# Patient Record
Sex: Male | Born: 1945 | Hispanic: No | Smoking: Never smoker
Health system: Southern US, Community
[De-identification: ages and names within clinical notes are randomized; demographics above are authoritative.]

## PROBLEM LIST (undated history)

## (undated) DIAGNOSIS — H3530 Unspecified macular degeneration: Secondary | ICD-10-CM

## (undated) DIAGNOSIS — T4145XA Adverse effect of unspecified anesthetic, initial encounter: Secondary | ICD-10-CM

## (undated) DIAGNOSIS — I10 Essential (primary) hypertension: Secondary | ICD-10-CM

## (undated) DIAGNOSIS — I209 Angina pectoris, unspecified: Secondary | ICD-10-CM

## (undated) DIAGNOSIS — Z9114 Patient's other noncompliance with medication regimen: Secondary | ICD-10-CM

## (undated) DIAGNOSIS — R0602 Shortness of breath: Secondary | ICD-10-CM

## (undated) DIAGNOSIS — K219 Gastro-esophageal reflux disease without esophagitis: Secondary | ICD-10-CM

## (undated) DIAGNOSIS — F200 Paranoid schizophrenia: Secondary | ICD-10-CM

## (undated) DIAGNOSIS — G4733 Obstructive sleep apnea (adult) (pediatric): Secondary | ICD-10-CM

## (undated) DIAGNOSIS — Z91148 Patient's other noncompliance with medication regimen for other reason: Secondary | ICD-10-CM

## (undated) DIAGNOSIS — R569 Unspecified convulsions: Secondary | ICD-10-CM

## (undated) DIAGNOSIS — K2210 Ulcer of esophagus without bleeding: Secondary | ICD-10-CM

## (undated) DIAGNOSIS — T8859XA Other complications of anesthesia, initial encounter: Secondary | ICD-10-CM

## (undated) DIAGNOSIS — F32A Depression, unspecified: Secondary | ICD-10-CM

## (undated) DIAGNOSIS — G20A1 Parkinson's disease without dyskinesia, without mention of fluctuations: Secondary | ICD-10-CM

## (undated) DIAGNOSIS — N400 Enlarged prostate without lower urinary tract symptoms: Secondary | ICD-10-CM

## (undated) DIAGNOSIS — I2510 Atherosclerotic heart disease of native coronary artery without angina pectoris: Secondary | ICD-10-CM

## (undated) DIAGNOSIS — E1142 Type 2 diabetes mellitus with diabetic polyneuropathy: Secondary | ICD-10-CM

## (undated) DIAGNOSIS — G20 Parkinson's disease: Secondary | ICD-10-CM

## (undated) DIAGNOSIS — Z77098 Contact with and (suspected) exposure to other hazardous, chiefly nonmedicinal, chemicals: Secondary | ICD-10-CM

## (undated) DIAGNOSIS — E785 Hyperlipidemia, unspecified: Secondary | ICD-10-CM

## (undated) DIAGNOSIS — F419 Anxiety disorder, unspecified: Secondary | ICD-10-CM

## (undated) DIAGNOSIS — A159 Respiratory tuberculosis unspecified: Secondary | ICD-10-CM

## (undated) DIAGNOSIS — F329 Major depressive disorder, single episode, unspecified: Secondary | ICD-10-CM

## (undated) DIAGNOSIS — F4310 Post-traumatic stress disorder, unspecified: Secondary | ICD-10-CM

## (undated) DIAGNOSIS — S37009A Unspecified injury of unspecified kidney, initial encounter: Secondary | ICD-10-CM

## (undated) DIAGNOSIS — E119 Type 2 diabetes mellitus without complications: Secondary | ICD-10-CM

## (undated) HISTORY — PX: TRANSURETHRAL RESECTION OF PROSTATE: SHX73

## (undated) HISTORY — PX: NASAL SEPTUM SURGERY: SHX37

## (undated) HISTORY — PX: OTHER SURGICAL HISTORY: SHX169

## (undated) HISTORY — PX: RETINAL DETACHMENT SURGERY: SHX105

## (undated) HISTORY — PX: UVULOPALATOPHARYNGOPLASTY: SHX827

## (undated) HISTORY — PX: CORNEAL TRANSPLANT: SHX108

---
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
## 1989-05-18 HISTORY — PX: CHOLECYSTECTOMY: SHX55

## 2001-08-23 ENCOUNTER — Emergency Department (HOSPITAL_COMMUNITY): Admission: EM | Admit: 2001-08-23 | Discharge: 2001-08-23 | Payer: Self-pay | Admitting: Emergency Medicine

## 2004-03-10 ENCOUNTER — Emergency Department (HOSPITAL_COMMUNITY): Admission: EM | Admit: 2004-03-10 | Discharge: 2004-03-10 | Payer: Self-pay | Admitting: Emergency Medicine

## 2005-05-04 ENCOUNTER — Emergency Department (HOSPITAL_COMMUNITY): Admission: EM | Admit: 2005-05-04 | Discharge: 2005-05-04 | Payer: Self-pay | Admitting: Emergency Medicine

## 2005-10-24 ENCOUNTER — Emergency Department (HOSPITAL_COMMUNITY): Admission: EM | Admit: 2005-10-24 | Discharge: 2005-10-24 | Payer: Self-pay | Admitting: Emergency Medicine

## 2005-10-31 ENCOUNTER — Emergency Department (HOSPITAL_COMMUNITY): Admission: EM | Admit: 2005-10-31 | Discharge: 2005-10-31 | Payer: Self-pay | Admitting: Podiatry

## 2005-12-13 ENCOUNTER — Inpatient Hospital Stay (HOSPITAL_COMMUNITY): Admission: EM | Admit: 2005-12-13 | Discharge: 2005-12-14 | Payer: Self-pay | Admitting: Emergency Medicine

## 2006-09-17 HISTORY — PX: CIRCUMCISION: SUR203

## 2006-10-03 ENCOUNTER — Emergency Department: Payer: Self-pay | Admitting: Emergency Medicine

## 2007-05-29 ENCOUNTER — Emergency Department (HOSPITAL_COMMUNITY): Admission: EM | Admit: 2007-05-29 | Discharge: 2007-05-29 | Payer: Self-pay | Admitting: Emergency Medicine

## 2008-09-29 ENCOUNTER — Ambulatory Visit (HOSPITAL_BASED_OUTPATIENT_CLINIC_OR_DEPARTMENT_OTHER): Admission: RE | Admit: 2008-09-29 | Discharge: 2008-09-29 | Payer: Self-pay | Admitting: Psychiatry

## 2008-10-02 ENCOUNTER — Ambulatory Visit: Payer: Self-pay | Admitting: Internal Medicine

## 2008-10-22 ENCOUNTER — Emergency Department (HOSPITAL_COMMUNITY): Admission: EM | Admit: 2008-10-22 | Discharge: 2008-10-23 | Payer: Self-pay | Admitting: Emergency Medicine

## 2009-04-29 ENCOUNTER — Emergency Department (HOSPITAL_COMMUNITY): Admission: EM | Admit: 2009-04-29 | Discharge: 2009-04-29 | Payer: Self-pay | Admitting: Emergency Medicine

## 2010-01-30 ENCOUNTER — Emergency Department (HOSPITAL_COMMUNITY): Admission: EM | Admit: 2010-01-30 | Discharge: 2010-01-30 | Payer: Self-pay | Admitting: Emergency Medicine

## 2010-12-04 LAB — GLUCOSE, CAPILLARY: Glucose-Capillary: 392 mg/dL — ABNORMAL HIGH (ref 70–99)

## 2010-12-04 LAB — URINALYSIS, ROUTINE W REFLEX MICROSCOPIC
Bilirubin Urine: NEGATIVE
Glucose, UA: >1000 mg/dL — AB
Nitrite: NEGATIVE
Protein, ur: NEGATIVE mg/dL
Specific Gravity, Urine: 1.021 (ref 1.005–1.030)
Urobilinogen, UA: 1 mg/dL (ref 0.0–1.0)
pH: 6.5 (ref 5.0–8.0)

## 2010-12-04 LAB — DIFFERENTIAL
Eosinophils Absolute: 0.2 10*3/uL (ref 0.0–0.7)
Eosinophils Relative: 2 % (ref 0–5)
Lymphocytes Relative: 22 % (ref 12–46)
Lymphs Abs: 1.7 10*3/uL (ref 0.7–4.0)
Neutrophils Relative %: 66 % (ref 43–77)

## 2010-12-04 LAB — RAPID URINE DRUG SCREEN, HOSP PERFORMED
Amphetamines: NOT DETECTED
Barbiturates: NOT DETECTED

## 2010-12-04 LAB — CBC
HCT: 43.4 % (ref 39.0–52.0)
MCHC: 35.5 g/dL (ref 30.0–36.0)
Platelets: 127 10*3/uL — ABNORMAL LOW (ref 150–400)
RBC: 4.59 MIL/uL (ref 4.22–5.81)
WBC: 7.7 10*3/uL (ref 4.0–10.5)

## 2010-12-04 LAB — ETHANOL: Alcohol, Ethyl (B): <5 mg/dL (ref 0–10)

## 2010-12-04 LAB — COMPREHENSIVE METABOLIC PANEL
Albumin: 4.2 g/dL (ref 3.5–5.2)
BUN: 7 mg/dL (ref 6–23)
GFR calc Af Amer: >60 mL/min (ref >60)
GFR calc non Af Amer: 59 mL/min — ABNORMAL LOW (ref >60)
Potassium: 3 mEq/L — ABNORMAL LOW (ref 3.5–5.1)
Sodium: 132 mEq/L — ABNORMAL LOW (ref 135–145)
Total Bilirubin: 1.2 mg/dL (ref 0.3–1.2)
Total Protein: 6.9 g/dL (ref 6.0–8.3)

## 2010-12-23 LAB — URINE MICROSCOPIC-ADD ON

## 2010-12-23 LAB — GLUCOSE, CAPILLARY: Glucose-Capillary: 247 mg/dL — ABNORMAL HIGH (ref 70–99)

## 2010-12-23 LAB — URINALYSIS, ROUTINE W REFLEX MICROSCOPIC
Bilirubin Urine: NEGATIVE
Glucose, UA: 500 mg/dL — AB
Leukocytes, UA: NEGATIVE
Nitrite: NEGATIVE

## 2010-12-23 LAB — POCT I-STAT, CHEM 8
Calcium, Ion: 1.16 mmol/L (ref 1.12–1.32)
Hemoglobin: 15.6 g/dL (ref 13.0–17.0)

## 2011-01-02 LAB — URINALYSIS, ROUTINE W REFLEX MICROSCOPIC
Bilirubin Urine: NEGATIVE
Glucose, UA: >1000 mg/dL — AB
Hgb urine dipstick: NEGATIVE
Leukocytes, UA: NEGATIVE
Nitrite: NEGATIVE
Protein, ur: 100 mg/dL — AB
Specific Gravity, Urine: 1.045 — ABNORMAL HIGH (ref 1.005–1.030)
Urobilinogen, UA: 2 mg/dL — ABNORMAL HIGH (ref 0.0–1.0)
pH: 6 (ref 5.0–8.0)

## 2011-01-02 LAB — BASIC METABOLIC PANEL
BUN: 15 mg/dL (ref 6–23)
CO2: 27 mEq/L (ref 19–32)
Calcium: 9.5 mg/dL (ref 8.4–10.5)
Chloride: 95 mEq/L — ABNORMAL LOW (ref 96–112)
Creatinine, Ser: 1.08 mg/dL (ref 0.4–1.5)
GFR calc Af Amer: >60 mL/min (ref >60)
GFR calc non Af Amer: >60 mL/min (ref >60)
Glucose, Bld: 278 mg/dL — ABNORMAL HIGH (ref 70–99)
Potassium: 3.6 mEq/L (ref 3.5–5.1)
Sodium: 132 mEq/L — ABNORMAL LOW (ref 135–145)

## 2011-01-02 LAB — URINE MICROSCOPIC-ADD ON

## 2011-01-02 LAB — GLUCOSE, CAPILLARY

## 2011-01-30 NOTE — Procedures (Signed)
NAME:  Bruce [name], Bruce [name] NO.:  [id number]   MEDICAL RECORD NO.:  [id number]          PATIENT TYPE:  OUT   LOCATION:  SLEEP CENTER                 FACILITY:  [facility]   PHYSICIAN:  Clinton D. Maple Hudson, MD, FCCP, FACPDATE OF BIRTH:  May [birth year] 1947   DATE OF STUDY:  09/29/2008                            NOCTURNAL POLYSOMNOGRAM   REFERRING PHYSICIAN:   REFERRING PHYSICIAN:  Dr. August Saucer.   DATE OF STUDY:  September 29, 2008   INDICATION FOR STUDY:  Hypersomnia with sleep apnea.   EPWORTH SLEEPINESS SCORE:  Epworth sleepiness score 17/24.  BMI 32.3.  Weight 188 pounds.  Height 64 inches.  Neck 14 inches.   MEDICATIONS:  Home medications were charted and reviewed.  The patient  took insulin.   SLEEP ARCHITECTURE:  Split-study protocol.  During the diagnostic phase,  total sleep time 134 minutes with sleep efficiency 67.3%.  Stage I was  13.8%.  Stage II 86.2%.  Stage III absent.  REM absent.  Sleep latency  7.5 minutes.  Wake after sleep onset 41.5 minutes.  Arousal index 32.1.  Other than insulin, no bedtime medication was taken.   RESPIRATORY DATA:  Split-study protocol.  Apnea-hypopnea index (AHI)  33.9 per hour before CPAP.  A total of 76 events was counted including 3  obstructive apneas, 18 central apneas, 5 mixed apneas, and 50 hypopneas.  Events were not positional.  CPAP was titrated to 10 CWP, AHI 0.9 per  hour.  He chose a medium ResMed Quattro full face mask with heated  humidifier.   OXYGEN DATA:  Moderate snoring with oxygen desaturation to a nadir of  83% before CPAP.  After CPAP control, mean oxygen saturation on room air  was 95.9%.   CARDIAC DATA:  Normal sinus rhythm.   MOVEMENT/PARASOMNIA:  No significant limb movement disturbance.  Bathroom x1.   IMPRESSIONS/RECOMMENDATIONS:  1. Moderate obstructive sleep apnea/hypopnea syndrome, apnea-hypopnea      index 33.9 per hour with nonpositional events, moderate snoring at      oxygen desaturation to a  nadir of 83%.  2. Successful CPAP titration to 10 centimeters of water pressure,      apnea-hypopnea index 0.9 per hour.  He chose a medium ResMed      Quattro full face mask with heated humidifier.      Clinton D. Maple Hudson, MD, [facility], FACP  Diplomate, Biomedical engineer of Sleep Medicine  Electronically Signed     CDY/MEDQ  D:  10/02/2008 12:45:23  T:  10/03/2008 02:57:58  Job:  [id number]

## 2011-02-02 NOTE — Discharge Summary (Signed)
NAME:  Bruce [name], Bruce [name] NO.:  [id number]   MEDICAL RECORD NO.:  [id number]          PATIENT TYPE:  INP   LOCATION:  1422                         FACILITY:  [facility]   PHYSICIAN:  Melissa L. Ladona Ridgel, MD  DATE OF BIRTH:  [birth year]   DATE OF ADMISSION:  12/12/2005  DATE OF DISCHARGE:  12/14/2005                                 DISCHARGE SUMMARY   ADMISSION DIAGNOSIS:  Suicide attempt using insulin overdose.   DISCHARGE DIAGNOSIS:  1.  Suicidal ideation with a suicide attempt using insulin injection.  The      patient is currently under significant stress related to protracted      grief and depression related to his father's recent death and his recent      legal entanglement with the federal government.  The patient felt that      he could no longer take that and felt that taking insulin would be the      best way to go as it is very peaceful.  You just fall asleep.  Evidently      the patient took the dose of insulin and then heard voices telling him      that he should not have done that and should go to the hospital.  Thus      he drove himself to the hospital for care.  The patient was admitted to      the general medical telemetry floor and monitored with glucose      monitoring.  All of his medications regarding his insulin were held over      the past 24 to 48 hours.  He has had low blood sugars but appropriate      appetite and is eating his meals.  I, therefore, feel that he can      maintain his blood glucoses without D-5.  He should be monitored for the      next 24 hours with q.4h. blood sugar checks and his metformin should be      held until his blood sugars are appropriate for at least 24 to 48 hours.  2.  Suicidality.  The patient is still actively suicidal.  It is, therefore,      recommended that he be transferred for inpatient care.  In speaking with      the patient, federal marshal is handling his case.  He can be      transferred into the medical  care of the federal government where he      will be monitored until disposition for psychiatric care will be made.      The patient is actively suicidal and, therefore, should be on suicide      precautions.  3.  Glaucoma.  Attempts have been made to obtain the patient's medication      for his eyes.  He, however, does not know the name and [facility] has      that information.  I will attempt to call to obtain this before he      leaves the hospital, however.  This process is  also hampered by a      difficult to access system.  I will make every attempt to do the best to      discover these medications, but please be aware that he is on two eye      drops for glaucoma. One is Timoptic and the other is unknown.  Dosing at      this time is unknown.  4.  Bipolar disorder.  The patient should be maintained on his Depakote and      Abilify .  I realize that he likely will reevaluate by the government      for his psychiatric care but these medications have been in the past      been shown to control his behavior.  5.  Diabetes.  The patient evidently also takes Humulin insulin 30 units in      the morning and 20 units at night.  This should be held and he should      not have access to insulin.   DISCHARGE MEDICATIONS:  1.  Depakote 250 mg at bedtime.  2.  Abilify 15 mg at bedtime.  3.  Glucophage and insulin should be held.  4.  He can resume sliding scale insulin when his blood glucoses are improved      but this will be at the discretion of the practitioner taking care of      him.  5.  The patient also may be taking a drug which sounds like Prozac.  I will      not prescribe this medication and he should be evaluated by psychiatry      for further medications for his depression.   HISTORY OF PRESENT ILLNESS:  The patient is a very pleasant 65 year old male  with a past medical history for bipolar disorder, diabetes, and previous  multiple suicide attempts who presents to the  emergency room after he took  an overdose of insulin.  The patient states that after he found out about a  recent legal action against him, he became very distraught.  He has been  recovering from the death of his father and decided to take his life.  After  taking an overdose of a bottle and a half of insulin, he drove himself to  the hospital and requested assistance.   In the emergency room, the patient's blood sugars were in the 50's.  He was  treated appropriately and then admitted to the telemetry floor for further  evaluation.  Subsequently the patient had been off all diabetes medications  and insulin.  His blood sugars have been low but appropriate and he has been  able to resuscitate himself with oral intake when his blood sugar drops.   I have contacted the federal marshals' office with regard to this patient's  legal case. He was supposed to turn himself in yesterday.  The federal  marshals will, therefore, pick him up to be remanded to the medical care of  the federal government during a period of waiting for his legal action to  proceed forward.   During the course of the hospital stay with me, the patient has been very  pleasant. He has had no further suicidal gestures although he does state he  will kill himself again if given the opportunity.  He is very matter of fact  about this and has plans that are outlined for that.  He insists that he  does not wish to die a violent death especially  pointing out that he would  never shoot himself or anything like this but he will die is what he told  me.  He does on occasion hear voices which tell him to do things the  specifics of which were not outlined by him although he did state that on  the night that he tried to take his life, voices told him to go to the  hospital, that he was really stupid.   PHYSICAL EXAMINATION:  VITAL SIGNS: On the day of discharge, the patient's vital signs are stable.  Temperature is 98.2, blood  pressure 129/81, pulse  62, respirations 20, oxygen saturation 100%.  His blood sugars have ranged  at the lowest of 79 to their peak at 115.  He has been asymptomatic with all  low blood sugars.  GENERAL:  The patient is generally well-developed, well-nourished,  moderately obese male in no acute distress.  HEENT:  Normocephalic, atraumatic.  Pupils are equal, round and reactive to  light.  Extraocular muscles were intact.  Mucous membranes are moist.  NECK:  Supple. There is no JVD, no lymph nodes, no carotid bruits.  CHEST:  Clear to auscultation.  No rhonchi, rales or wheezes.  CARDIOVASCULAR:  Regular rate and rhythm.  Positive S1 and S2.  No S3 or S4.  No murmurs, rubs or gallops.  ABDOMEN:  Obese, nontender, nondistended with positive bowel sounds.  EXTREMITIES:  Showed no clubbing, cyanosis or edema.  NEUROLOGIC:  Awake, alert, and oriented.  Cranial nerves II-XII intact.  Power is 5/5.  Deep tendon reflexes are 2+.  PSYCHIATRIC:  The patient is matter-of-fact about his desire to kill himself  and he states, I will do it again.   At this time the patient is deemed medical stable to transfer him to a new  medical situation where he will be monitored for his blood sugars and for  his suicidal ideation.  The patient has asked if I would please call his  mother in [place] but indicates there has been a call block on that  number. He does state he is in good graces with her but he has not told her  about his arrest nor does she know about his suicide attempt.  At this time  I will defer to the Federal Agents regarding this as I have no information  about the future of his legal problems and do not wish to interfere in the  legal process or the familial process if it is not warranted.   CONDITION ON DISCHARGE:  Clinically stable but requiring further monitoring  for suicidality in particular.   DISPOSITION:  To the possession of the federal marshals.      Melissa L. Ladona Ridgel,  MD  Electronically Signed     MLT/MEDQ  D:  12/14/2005  T:  12/14/2005  Job:  [id number]

## 2011-02-02 NOTE — H&P (Signed)
NAME:  Bruce [name], Bruce [name] NO.:  [id number]   MEDICAL RECORD NO.:  [id number]          PATIENT TYPE:  OBV   LOCATION:  1422                         FACILITY:  [facility]   PHYSICIAN:  Hollice Espy, M.D.DATE OF BIRTH:  06-23-1946   DATE OF ADMISSION:  12/12/2005  DATE OF DISCHARGE:                                HISTORY & PHYSICAL   PRIMARY CARE PHYSICIAN:  [place].   PSYCHIATRIST:  In [place], as well.   CHIEF COMPLAINT:  Suicide attempt by overdose of insulin.   HISTORY OF PRESENT ILLNESS:  The patient is a 65 year old white male with a  past medical history of bipolar disorder, diabetes mellitus and previous  multiple suicide attempts who presented to the emergency room after he took  an overdose of insulin.  The patient tells me that life especially has been  very stressful.  His father recently passed away from diabetes  complications, and he is facing legal issues, as well.  He could not take it  anymore, and then today he took a bottle and a half of NovoLog insulin.  He  started to have a change of heart and spoke to his doctor, who advised him  to come to the emergency room for treatment.  The patient came to the  emergency room and was started on a D-5 saline drip.  Initially when he had  come in, his blood sugars were in the 150s; however, over the course of  several hours, his blood sugars started trending down, down to about 50.  The patient's blood sugar started to improve on the Dextrostrip, and since  then has come up to the 70s and 80s.  Currently, his last blood sugar was  120.  The patient, however, still expressed suicidal ideation, saying that  he thinks that if he leaves he will continue to try to kill himself again.  He is willing, however, to try to get some help.  The patient currently is  feeling okay.  He denies any headaches, vision changes, dysphagia, chest  pain, palpitations, shortness of breath, wheeze, cough, abdominal pain,  hematuria, dysuria, constipation, diarrhea, focal extremity numbness,  weakness or pain.   REVIEW OF SYSTEMS:  Otherwise negative.   PAST MEDICAL HISTORY:  1.  Bipolar disorder.  2.  Multiple suicide attempts.  3.  Diabetes.   MEDICATIONS:  1.  Insulin NovoLog sliding scale.  2.  Humulin insulin 30 units in the morning, 20 at night.  3.  Metformin 500 p.o. b.i.d.  4.  Abilify 50 mg p.o. q.h.s.  5.  Depakote; he is unsure of the dose, but he takes 2 pills at night.   ALLERGIES:  CODEINE.   SOCIAL HISTORY:  He denies any alcohol or tobacco use, but does use  marijuana for religious purposes, which is consistent with his [place]  religion.   FAMILY HISTORY:  Notable for diabetes.   PHYSICAL EXAMINATION:  VITAL SIGNS:  The patient's vitals on admission  revealed a temperature of 98.3, heart rate 84, blood pressure 152/92, now  down to 137/76, respirations 16, O2 saturation 95% on  room air.  GENERAL:  The patient is alert and oriented x3.  In no apparent distress.  HEENT:  Normocephalic and atraumatic.  Mucous membranes are moist.  He has  no carotid bruits.  HEART:  Regular rate and rhythm.  S1 and S2.  LUNGS:  Clear to auscultation bilaterally.  ABDOMEN:  Soft, nontender, nondistended.  Positive bowel sounds.  EXTREMITIES:  No clubbing, cyanosis.  Trace pitting edema.  1+ peripheral  pulses.   LABORATORY WORK:  White count 5.1, hemoglobin and hematocrit 11.7 and 35.3,  MCV of 75.6, platelet count of 173.  Sodium 141, potassium 2.4, chloride  106, bicarbonate 29, BUN 11, creatinine 1.2, glucose 183.  LFT's  unremarkable.  Urine drug screen only noted for THC.  Alcohol level less  than 5.   ASSESSMENT AND PLAN:  1.  Hypoglycemia secondary to intentional overdose of insulin.  The patient      appears to be through the worst, but will continue to monitor his CBGs.      Will continue to follow the patient's CBGs q.2h. x12 hours, then every 6      hours.  Will also continue the  Dextrostrip.  Once he starts to become      hyperglycemic, we will resume his insulin and metformin.  2.  Bipolar disorder.  Will continue the Abilify and Depakote.  I have      already put in a consult for Behavioral Health who will see the patient,      and may very well likely need inpatient commitment.  In the meantime,      will have a 24-hour sitter and suicide precautions.      Hollice Espy, M.D.  Electronically Signed     SKK/MEDQ  D:  12/12/2005  T:  12/13/2005  Job:  [id number]   cc:   [facility]   Antonietta Breach, M.D.

## 2011-06-29 LAB — URINALYSIS, ROUTINE W REFLEX MICROSCOPIC
Glucose, UA: >1000 — AB
Ketones, ur: NEGATIVE

## 2011-06-29 LAB — CBC
HCT: 40.3
Hemoglobin: 14.2
MCHC: 35.2
MCV: 90.9
Platelets: 113 — ABNORMAL LOW
RBC: 4.43
RDW: 12.9
WBC: 5.7

## 2011-06-29 LAB — DIFFERENTIAL
Basophils Relative: 0
Eosinophils Relative: 5
Lymphocytes Relative: 36
Lymphs Abs: 2

## 2011-06-29 LAB — BASIC METABOLIC PANEL
BUN: 5 — ABNORMAL LOW
Calcium: 8.9
Chloride: 106
Creatinine, Ser: 0.99
GFR calc non Af Amer: >60
Glucose, Bld: 312 — ABNORMAL HIGH
Potassium: 3.4 — ABNORMAL LOW
Sodium: 137

## 2011-06-29 LAB — URINE MICROSCOPIC-ADD ON: Urine-Other: NONE SEEN

## 2012-02-16 DIAGNOSIS — I2510 Atherosclerotic heart disease of native coronary artery without angina pectoris: Secondary | ICD-10-CM

## 2012-02-16 HISTORY — DX: Atherosclerotic heart disease of native coronary artery without angina pectoris: I25.10

## 2012-03-02 ENCOUNTER — Inpatient Hospital Stay (HOSPITAL_COMMUNITY): Payer: Medicare Other

## 2012-03-02 ENCOUNTER — Inpatient Hospital Stay (HOSPITAL_COMMUNITY)
Admission: EM | Admit: 2012-03-02 | Discharge: 2012-03-07 | DRG: 247 | Disposition: A | Discharge status: Home / Self Care | Payer: Medicare Other | Attending: Cardiology | Admitting: Cardiology

## 2012-03-02 ENCOUNTER — Encounter (HOSPITAL_COMMUNITY): Payer: Self-pay | Admitting: *Deleted

## 2012-03-02 ENCOUNTER — Encounter (HOSPITAL_COMMUNITY)
Admission: EM | Disposition: A | Discharge status: Home / Self Care | Payer: Self-pay | Source: Home / Self Care | Attending: Cardiology

## 2012-03-02 DIAGNOSIS — R079 Chest pain, unspecified: Secondary | ICD-10-CM

## 2012-03-02 DIAGNOSIS — F200 Paranoid schizophrenia: Secondary | ICD-10-CM | POA: Diagnosis present

## 2012-03-02 DIAGNOSIS — E119 Type 2 diabetes mellitus without complications: Secondary | ICD-10-CM | POA: Diagnosis present

## 2012-03-02 DIAGNOSIS — I2510 Atherosclerotic heart disease of native coronary artery without angina pectoris: Secondary | ICD-10-CM

## 2012-03-02 DIAGNOSIS — G4733 Obstructive sleep apnea (adult) (pediatric): Secondary | ICD-10-CM | POA: Diagnosis present

## 2012-03-02 DIAGNOSIS — Z91199 Patient's noncompliance with other medical treatment and regimen due to unspecified reason: Secondary | ICD-10-CM

## 2012-03-02 DIAGNOSIS — I2119 ST elevation (STEMI) myocardial infarction involving other coronary artery of inferior wall: Secondary | ICD-10-CM

## 2012-03-02 DIAGNOSIS — Z794 Long term (current) use of insulin: Secondary | ICD-10-CM

## 2012-03-02 DIAGNOSIS — F209 Schizophrenia, unspecified: Secondary | ICD-10-CM

## 2012-03-02 DIAGNOSIS — I2582 Chronic total occlusion of coronary artery: Secondary | ICD-10-CM | POA: Diagnosis present

## 2012-03-02 DIAGNOSIS — I213 ST elevation (STEMI) myocardial infarction of unspecified site: Secondary | ICD-10-CM

## 2012-03-02 DIAGNOSIS — F4310 Post-traumatic stress disorder, unspecified: Secondary | ICD-10-CM | POA: Diagnosis present

## 2012-03-02 DIAGNOSIS — I10 Essential (primary) hypertension: Secondary | ICD-10-CM | POA: Diagnosis present

## 2012-03-02 DIAGNOSIS — Z9119 Patient's noncompliance with other medical treatment and regimen: Secondary | ICD-10-CM

## 2012-03-02 HISTORY — DX: Atherosclerotic heart disease of native coronary artery without angina pectoris: I25.10

## 2012-03-02 HISTORY — PX: CARDIAC CATHETERIZATION: SHX172

## 2012-03-02 HISTORY — DX: Post-traumatic stress disorder, unspecified: F43.10

## 2012-03-02 HISTORY — DX: Hyperlipidemia, unspecified: E78.5

## 2012-03-02 HISTORY — DX: Benign prostatic hyperplasia without lower urinary tract symptoms: N40.0

## 2012-03-02 HISTORY — DX: Paranoid schizophrenia: F20.0

## 2012-03-02 HISTORY — DX: Essential (primary) hypertension: I10

## 2012-03-02 HISTORY — PX: LEFT HEART CATHETERIZATION WITH CORONARY ANGIOGRAM: SHX5451

## 2012-03-02 LAB — TSH: TSH: 1.581 u[IU]/mL (ref 0.350–4.500)

## 2012-03-02 LAB — DIFFERENTIAL
Basophils Relative: 0 % (ref 0–1)
Eosinophils Absolute: 0.1 10*3/uL (ref 0.0–0.7)
Eosinophils Relative: 1 % (ref 0–5)
Monocytes Absolute: 0.3 10*3/uL (ref 0.1–1.0)
Monocytes Relative: 4 % (ref 3–12)
Neutrophils Relative %: 81 % — ABNORMAL HIGH (ref 43–77)

## 2012-03-02 LAB — COMPREHENSIVE METABOLIC PANEL
ALT: 32 U/L (ref 0–53)
AST: 95 U/L — ABNORMAL HIGH (ref 0–37)
CO2: 24 mEq/L (ref 19–32)
Calcium: 8.5 mg/dL (ref 8.4–10.5)
GFR calc non Af Amer: >90 mL/min (ref >90)
Sodium: 133 mEq/L — ABNORMAL LOW (ref 135–145)

## 2012-03-02 LAB — CBC
HCT: 40 % (ref 39.0–52.0)
HCT: 39.9 % (ref 39.0–52.0)
Hemoglobin: 14.4 g/dL (ref 13.0–17.0)
Hemoglobin: 14.5 g/dL (ref 13.0–17.0)
MCH: 32.6 pg (ref 26.0–34.0)
MCH: 32.7 pg (ref 26.0–34.0)
MCHC: 36 g/dL (ref 30.0–36.0)
MCHC: 36.3 g/dL — ABNORMAL HIGH (ref 30.0–36.0)
MCV: 90.5 fL (ref 78.0–100.0)
RBC: 4.43 MIL/uL (ref 4.22–5.81)

## 2012-03-02 LAB — GLUCOSE, CAPILLARY: Glucose-Capillary: 130 mg/dL — ABNORMAL HIGH (ref 70–99)

## 2012-03-02 LAB — HEMOGLOBIN A1C: Hgb A1c MFr Bld: 7.8 % — ABNORMAL HIGH (ref <5.7)

## 2012-03-02 LAB — CARDIAC PANEL(CRET KIN+CKTOT+MB+TROPI)
CK, MB: 87.9 ng/mL (ref 0.3–4.0)
Relative Index: 7.3 — ABNORMAL HIGH (ref 0.0–2.5)
Total CK: 1201 U/L — ABNORMAL HIGH (ref 7–232)

## 2012-03-02 SURGERY — LEFT HEART CATHETERIZATION WITH CORONARY ANGIOGRAM
Anesthesia: LOCAL

## 2012-03-02 MED ORDER — EPTIFIBATIDE 75 MG/100ML IV SOLN
INTRAVENOUS | Status: AC
  Administered 2012-03-02: 75000 ug
  Filled 2012-03-02: qty 100

## 2012-03-02 MED ORDER — ACETAMINOPHEN 325 MG PO TABS: 650.0000 mg | ORAL_TABLET | ORAL | Status: DC | PRN

## 2012-03-02 MED ORDER — METOPROLOL TARTRATE 12.5 MG HALF TABLET
12.5000 mg | ORAL_TABLET | Freq: Two times a day (BID) | ORAL | Status: DC
  Administered 2012-03-02 – 2012-03-07 (×10): 12.5 mg via ORAL
  Filled 2012-03-02 (×12): qty 1

## 2012-03-02 MED ORDER — ZOLPIDEM TARTRATE 5 MG PO TABS: 5.0000 mg | ORAL_TABLET | Freq: Every evening | ORAL | Status: DC | PRN

## 2012-03-02 MED ORDER — TAMSULOSIN HCL 0.4 MG PO CAPS
0.4000 mg | ORAL_CAPSULE | Freq: Every day | ORAL | Status: DC
  Administered 2012-03-02 – 2012-03-07 (×6): 0.4 mg via ORAL
  Filled 2012-03-02 (×6): qty 1

## 2012-03-02 MED ORDER — SODIUM CHLORIDE 0.9 % IJ SOLN: 3.0000 mL | INTRAMUSCULAR | Status: DC | PRN

## 2012-03-02 MED ORDER — EPTIFIBATIDE 75 MG/100ML IV SOLN: 13.6000 mg/h | Freq: Once | INTRAVENOUS | Status: DC

## 2012-03-02 MED ORDER — DIAZEPAM 2 MG PO TABS
2.0000 mg | ORAL_TABLET | Freq: Three times a day (TID) | ORAL | Status: DC | PRN
  Administered 2012-03-04 – 2012-03-05 (×5): 2 mg via ORAL
  Filled 2012-03-02 (×4): qty 1

## 2012-03-02 MED ORDER — SODIUM CHLORIDE 0.9 % IV SOLN: 250.0000 mL | INTRAVENOUS | Status: DC | PRN

## 2012-03-02 MED ORDER — SODIUM CHLORIDE 0.9 % IJ SOLN
3.0000 mL | Freq: Two times a day (BID) | INTRAMUSCULAR | Status: DC
  Administered 2012-03-02 – 2012-03-07 (×9): 3 mL via INTRAVENOUS

## 2012-03-02 MED ORDER — VERAPAMIL HCL 2.5 MG/ML IV SOLN
INTRAVENOUS | Status: AC
  Filled 2012-03-02: qty 2

## 2012-03-02 MED ORDER — HEPARIN BOLUS VIA INFUSION
4000.0000 [IU] | Freq: Once | INTRAVENOUS | Status: AC
  Administered 2012-03-02: 4000 [IU] via INTRAVENOUS

## 2012-03-02 MED ORDER — FENTANYL CITRATE 0.05 MG/ML IJ SOLN
INTRAMUSCULAR | Status: AC
  Filled 2012-03-02: qty 2

## 2012-03-02 MED ORDER — INSULIN ASPART PROT & ASPART (70-30 MIX) 100 UNIT/ML ~~LOC~~ SUSP
20.0000 [IU] | Freq: Every day | SUBCUTANEOUS | Status: DC
  Administered 2012-03-02 – 2012-03-07 (×5): 20 [IU] via SUBCUTANEOUS

## 2012-03-02 MED ORDER — BIVALIRUDIN 250 MG IV SOLR
INTRAVENOUS | Status: AC
  Filled 2012-03-02: qty 250

## 2012-03-02 MED ORDER — ONDANSETRON HCL 4 MG/2ML IJ SOLN
4.0000 mg | Freq: Four times a day (QID) | INTRAMUSCULAR | Status: DC | PRN
  Administered 2012-03-05: 4 mg via INTRAVENOUS
  Filled 2012-03-02: qty 2

## 2012-03-02 MED ORDER — ACETAMINOPHEN 325 MG PO TABS
650.0000 mg | ORAL_TABLET | ORAL | Status: DC | PRN
  Administered 2012-03-05: 650 mg via ORAL
  Filled 2012-03-02: qty 2

## 2012-03-02 MED ORDER — EPTIFIBATIDE 75 MG/100ML IV SOLN
INTRAVENOUS | Status: AC
  Filled 2012-03-02: qty 100

## 2012-03-02 MED ORDER — INSULIN ASPART PROT & ASPART (70-30 MIX) 100 UNIT/ML ~~LOC~~ SUSP
15.0000 [IU] | Freq: Every day | SUBCUTANEOUS | Status: DC
  Administered 2012-03-03 – 2012-03-07 (×5): 15 [IU] via SUBCUTANEOUS
  Filled 2012-03-02: qty 10

## 2012-03-02 MED ORDER — ASPIRIN 81 MG PO CHEW
81.0000 mg | CHEWABLE_TABLET | Freq: Every day | ORAL | Status: DC
  Administered 2012-03-03 – 2012-03-07 (×5): 81 mg via ORAL
  Filled 2012-03-02 (×5): qty 1

## 2012-03-02 MED ORDER — NITROGLYCERIN 0.4 MG SL SUBL: 0.4000 mg | SUBLINGUAL_TABLET | SUBLINGUAL | Status: DC | PRN

## 2012-03-02 MED ORDER — POTASSIUM CHLORIDE CRYS ER 20 MEQ PO TBCR
40.0000 meq | EXTENDED_RELEASE_TABLET | Freq: Once | ORAL | Status: AC
  Administered 2012-03-02: 40 meq via ORAL
  Filled 2012-03-02: qty 2

## 2012-03-02 MED ORDER — ASPIRIN EC 81 MG PO TBEC: 81.0000 mg | DELAYED_RELEASE_TABLET | Freq: Every day | ORAL | Status: DC

## 2012-03-02 MED ORDER — INSULIN ASPART PROT & ASPART (70-30 MIX) 100 UNIT/ML ~~LOC~~ SUSP
15.0000 [IU] | Freq: Every day | SUBCUTANEOUS | Status: DC
  Filled 2012-03-02: qty 3

## 2012-03-02 MED ORDER — ATORVASTATIN CALCIUM 80 MG PO TABS
80.0000 mg | ORAL_TABLET | Freq: Every day | ORAL | Status: DC
  Administered 2012-03-02 – 2012-03-05 (×4): 80 mg via ORAL
  Filled 2012-03-02 (×6): qty 1

## 2012-03-02 MED ORDER — TICAGRELOR 90 MG PO TABS
ORAL_TABLET | ORAL | Status: AC
  Administered 2012-03-03: 90 mg via ORAL
  Filled 2012-03-02: qty 2

## 2012-03-02 MED ORDER — ONDANSETRON HCL 4 MG/2ML IJ SOLN
INTRAMUSCULAR | Status: AC
  Filled 2012-03-02: qty 2

## 2012-03-02 MED ORDER — MIDAZOLAM HCL 2 MG/2ML IJ SOLN
INTRAMUSCULAR | Status: AC
  Filled 2012-03-02: qty 2

## 2012-03-02 MED ORDER — ALPRAZOLAM 0.25 MG PO TABS
0.2500 mg | ORAL_TABLET | Freq: Two times a day (BID) | ORAL | Status: DC | PRN
  Administered 2012-03-03 – 2012-03-05 (×6): 0.25 mg via ORAL
  Filled 2012-03-02 (×6): qty 1

## 2012-03-02 MED ORDER — LIDOCAINE HCL (PF) 1 % IJ SOLN
INTRAMUSCULAR | Status: AC
  Filled 2012-03-02: qty 30

## 2012-03-02 MED ORDER — NITROGLYCERIN 0.2 MG/ML ON CALL CATH LAB
INTRAVENOUS | Status: AC
  Filled 2012-03-02: qty 1

## 2012-03-02 MED ORDER — HEPARIN SODIUM (PORCINE) 5000 UNIT/ML IJ SOLN
INTRAMUSCULAR | Status: AC
  Filled 2012-03-02: qty 1

## 2012-03-02 MED ORDER — TICAGRELOR 90 MG PO TABS
90.0000 mg | ORAL_TABLET | Freq: Two times a day (BID) | ORAL | Status: DC
  Administered 2012-03-02 – 2012-03-04 (×5): 90 mg via ORAL
  Filled 2012-03-02 (×5): qty 1

## 2012-03-02 MED ORDER — SODIUM CHLORIDE 0.9 % IV SOLN
INTRAVENOUS | Status: AC
  Administered 2012-03-02: 14:00:00 via INTRAVENOUS

## 2012-03-02 MED ORDER — BIVALIRUDIN 250 MG IV SOLR
INTRAVENOUS | Status: AC
Start: 2012-03-02 — End: 2012-03-02
  Filled 2012-03-02: qty 250

## 2012-03-02 MED ORDER — INSULIN ASPART 100 UNIT/ML ~~LOC~~ SOLN
0.0000 [IU] | Freq: Three times a day (TID) | SUBCUTANEOUS | Status: DC
  Administered 2012-03-02: 3 [IU] via SUBCUTANEOUS
  Administered 2012-03-03: 2 [IU] via SUBCUTANEOUS
  Administered 2012-03-03: 3 [IU] via SUBCUTANEOUS
  Administered 2012-03-03 – 2012-03-04 (×2): 2 [IU] via SUBCUTANEOUS
  Administered 2012-03-04: 3 [IU] via SUBCUTANEOUS
  Administered 2012-03-05 (×3): 2 [IU] via SUBCUTANEOUS
  Administered 2012-03-06: 3 [IU] via SUBCUTANEOUS
  Administered 2012-03-07: 5 [IU] via SUBCUTANEOUS
  Administered 2012-03-07: 2 [IU] via SUBCUTANEOUS

## 2012-03-02 MED ORDER — HEPARIN (PORCINE) IN NACL 2-0.9 UNIT/ML-% IJ SOLN
INTRAMUSCULAR | Status: AC
  Filled 2012-03-02: qty 2000
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
## 2012-03-02 NOTE — Progress Notes (Signed)
Patient Bruce [name]. Bruce [name], 66 year old male arrived at E.D. via EMS with chest pains.  Chaplain received page of a Code Stemi, and lifted a silent prayer for patient in the Cath Lab.  Patient reported having no local relatives.  Chaplain and a nurse attempted to call patient's daughter-in-law in [name] [phone] & [phone].  We were unable to talk with her.  I will follow-up as needed.

## 2012-03-02 NOTE — ED Notes (Signed)
Transported to cath lab, monitored, with md and rn. [id number]

## 2012-03-02 NOTE — ED Notes (Signed)
Patient given heparin 4000 units ivp at 11:27
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
## 2012-03-02 NOTE — CV Procedure (Signed)
Cardiac Catheterization Procedure Note  Name: ANTOIN DARGIS MRN: [id number] DOB: 23-Dec-1945  Procedure: Left Heart Cath, Selective Coronary Angiography, LV angiography, PTCA and stenting of the RCA  Indication:   Acute inferior MI  Procedural Details:  Following informed emergency consent, the patient was brought emergently to the cath lab.   The right wrist was prepped, draped, and anesthetized with 1% lidocaine. Using the modified Seldinger technique, a 6 French sheath was introduced into the right radial artery. 3 mg of verapamil was administered through the sheath, weight-based unfractionated heparin was already administered intravenously in the ER per STEMI protocol. Standard Judkins catheters were used for selective coronary angiography. Catheter exchanges were performed over an exchange length guidewire.  PCI Note:  Following the diagnostic procedure, the decision was made to proceed with PCI. Weight-based bivalirudin was given for anticoagulation. Once a therapeutic ACT was achieved, a 6 [place] JR4 [facility] guide catheter was inserted.  A prowater coronary guidewire was used to cross the lesion.  The lesion was predilated with a 2.0 by 15mm BS balloon.  The lesion was then stented with a 2.25 by 32mm Promus Element stent.  The stent was postdilated with a 2.75mm noncompliant balloon.  Following PCI, there remained a modest area of malexpansion, and a 2.25 [place] balloon was taken to high pressures.  Post views demonstrated what appeared to be either tissue prolapse, or recurrent thrombus, favor the former, within the more proximal aspect of the stent.  We felt the artery was borderline with regard to IVUS, so we elected to pass a second stent, which was a 2.25 by 12 stent  This was deployed at 16 atm with full expansion.  We then elected to administer double bolus eptifibatide as an adjunct.  . Final angiography confirmed an excellent result.  LV angio was then done.  The patient tolerated the procedure  well. There were no immediate procedural complications. A TR band was used for radial hemostasis. The patient was transferred to the post catheterization recovery area for further monitoring.  Chest pain and ST changes were resolved.    PROCEDURAL FINDINGS Hemodynamics: AO 159/65 (105) LV 128/7/12 No gradient on pullback.    Coronary angiography: Coronary dominance: right  Left mainstem: Mildly calcified with smooth distal 30% tapered narrowing.  No hemodynamic obstruction.  Left anterior descending (LAD): Fairly heavily calcified with  30-40% narrowing.  The diagonal has ostial 40-50% narrowing.  Both have a modestly diabetic appearance.   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 The ramus has a mid smooth 40% area of segmental plaque.  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  Right coronary artery (RCA): The RCA was totally occluded proximally.  Following reperfusion, there was a high grade area of narrowing that extended over 25-87mm of vessel.  Following initial stenting and post dil, there was marked improvement.  However, there appeared to be tissue prolapse, which was treated with a second stent.  There is a large acute marginal that supplies the distal inferior wall.  It has 60% proximal segmental narrowing.  The distal RCA has 40-50% segmental plaque.  There are three distal branches, fairly small in caliber.    Left ventriculography: Left ventricular systolic function is mildly reduced, LVEF is estimated at 50%, there is no significant mitral regurgitation.  There is mild inferior hypokinesis.    Marland Kitchen  PCI Data: Vessel - RCA/Segment - 2 Percent Stenosis (pre)  100 TIMI-flow 0  Stent Promus Element 2.25 by 32mm, and 12 by 2.25 placed inside for tissue prolapse through stent Percent Stenosis (post) 0% TIMI-flow (post) 3  Final Conclusions:   1.  Inferior wall MI with early reperfusion via radial approach with successful  stenting of the RCA 2.  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  3.  Overall preserved LV function.   Recommendations:   1.  Treatment of other issues    -psych consult in am 2.  ASA/Brlinita 3.  6 hours of eptifibatide given tissue and or thrombus 4.  Cardiac rehab phase 1 5.  Plan fast track of issues.    [name] 03/02/2012, 1:26 PM

## 2012-03-02 NOTE — ED Notes (Signed)
Patient reports that he could not breathe today.  He went to his neighbors and called ems.  Patient with noted exp wheezing on scene,  Patient given neb treatment on scene, once placed in truck,  ekg ran and noted to have st elevation,  stemi called.  Patient arrives to ED,  Reported to have received aspirin 324 mg, nitro x 2, and morphine 4 mg.  Patient with decreased chest pain per patient.  Patient reports he has been out of medications for a while but has been taking his insulin.  Cardiology at bedside.  Patient with one iv in place upon arrival.

## 2012-03-03 ENCOUNTER — Encounter (HOSPITAL_COMMUNITY): Payer: Self-pay | Admitting: *Deleted

## 2012-03-03 LAB — LIPID PANEL
Cholesterol: 147 mg/dL (ref 0–200)
Triglycerides: 193 mg/dL — ABNORMAL HIGH (ref <150)
VLDL: 39 mg/dL (ref 0–40)

## 2012-03-03 LAB — POCT I-STAT, CHEM 8
BUN: 5 mg/dL — ABNORMAL LOW (ref 6–23)
Calcium, Ion: 1.08 mmol/L — ABNORMAL LOW (ref 1.12–1.32)
Hemoglobin: 14.3 g/dL (ref 13.0–17.0)
Sodium: 139 mEq/L (ref 135–145)
TCO2: 19 mmol/L (ref 0–100)

## 2012-03-03 LAB — BASIC METABOLIC PANEL
BUN: 5 mg/dL — ABNORMAL LOW (ref 6–23)
Chloride: 105 mEq/L (ref 96–112)
GFR calc Af Amer: >90 mL/min (ref >90)
GFR calc non Af Amer: >90 mL/min (ref >90)
Potassium: 3.5 mEq/L (ref 3.5–5.1)
Sodium: 142 mEq/L (ref 135–145)

## 2012-03-03 LAB — CBC
HCT: 41.8 % (ref 39.0–52.0)
Hemoglobin: 14.6 g/dL (ref 13.0–17.0)
MCHC: 34.9 g/dL (ref 30.0–36.0)
RBC: 4.59 MIL/uL (ref 4.22–5.81)
WBC: 8.6 10*3/uL (ref 4.0–10.5)

## 2012-03-03 LAB — CARDIAC PANEL(CRET KIN+CKTOT+MB+TROPI)
CK, MB: 39.9 ng/mL (ref 0.3–4.0)
Troponin I: >20 ng/mL (ref <0.30)

## 2012-03-03 LAB — POCT ACTIVATED CLOTTING TIME: Activated Clotting Time: 544 seconds

## 2012-03-03 LAB — GLUCOSE, CAPILLARY
Glucose-Capillary: 184 mg/dL — ABNORMAL HIGH (ref 70–99)
Glucose-Capillary: 149 mg/dL — ABNORMAL HIGH (ref 70–99)
Glucose-Capillary: 163 mg/dL — ABNORMAL HIGH (ref 70–99)
Glucose-Capillary: 137 mg/dL — ABNORMAL HIGH (ref 70–99)
Glucose-Capillary: 90 mg/dL (ref 70–99)

## 2012-03-03 MED ORDER — SALINE SPRAY 0.65 % NA SOLN
1.0000 | NASAL | Status: DC | PRN
  Filled 2012-03-03: qty 44

## 2012-03-03 MED FILL — Dextrose Inj 5%: INTRAVENOUS | Qty: 50 | Status: AC

## 2012-03-03 NOTE — Progress Notes (Signed)
Pt states his sob is relieved after the xanax.  Also states he is still having some abdominal tenderness.
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
## 2012-03-03 NOTE — Progress Notes (Signed)
Pt c/o sob and abdominal pain/tenderness.    EKG done .  Rhonda Barrett notified.  No orders received.

## 2012-03-03 NOTE — Progress Notes (Signed)
Inpatient Diabetes Program Recommendations  AACE/ADA: New Consensus Statement on Inpatient Glycemic Control (2009)  Target Ranges:  Prepandial:   less than 140 mg/dL      Peak postprandial:   less than 180 mg/dL (1-2 hours)      Critically ill patients:  140 - 180 mg/dL   Reason for Visit: consult Diabetes Coordinator spoke with patient concerning "hypoglycemia" per consult.  Patient reports no problems with hypo but hyper.  Current regimen during this hospitalization seems to be more appropriate than documented home dose 70/30.  Consider discharging patient home on current regimen of 70/30 15 units with breakfast and 20 units with dinner.  Patient reports difficulty remembering sometimes to take his insulin but was pleasantly surprised that his A1C=7.8. He reports prior A1C being in the "teens".  We discussed ways to remember taking his insulin, including using his calendar on his phone.  He was agreeable.     Will follow during this admission.  Thank you  Piedad Climes [facility] Inpatient Diabetes Coordinator [phone]

## 2012-03-03 NOTE — Progress Notes (Signed)
CARDIAC REHAB PHASE I   PRE:  Rate/Rhythm: 67SR  BP:  Supine:   Sitting: 109/72  Standing:    SaO2: 99%RA  MODE:  Ambulation: 100 ft   POST:  Rate/Rhythem: 83SR  BP:  Supine:   Sitting: 108/74  Standing:    SaO2: 100%RA 1447-1510 Pt needed much assistance to stand. Uses cane at home. Pt had to hold to siderail with asst x 1 to walk. Bent over. States he has neuropathy. Needed much asst to walk. Would recommend PT to evaluate for D/c needs. When asked if pt falls at home he stated I just tuck and roll. To recliner after walk. Denied CP. Discussed with pt using walker next time but he was not in favor of this.   Duanne Limerick

## 2012-03-03 NOTE — Progress Notes (Signed)
Subjective:  He is better.  Does complain of some shortness of breath intermittently.  Objective:  Vital Signs in the last 24 hours: Temp:  [97.5 F (36.4 C)-98.4 F (36.9 C)] 98.3 F (36.8 C) (06/17 1100) Pulse Rate:  [55-75] 75  (06/17 1100) Resp:  [14-29] 19  (06/17 1100) BP: (102-133)/(63-84) 128/84 mmHg (06/17 1000) SpO2:  [96 %-100 %] 99 % (06/17 1100) Weight:  [175 lb 7.8 oz (79.6 kg)-188 lb 15 oz (85.7 kg)] 175 lb 7.8 oz (79.6 kg) (06/17 0600)  Intake/Output from previous day: 06/16 0701 - 06/17 0700 In: 1754.4 [P.O.:800; I.V.:954.4] Out: 3225 [Urine:3225]   Physical Exam: General: Well developed, well nourished, in no acute distress. Head:  Normocephalic and atraumatic. Lungs: Clear to auscultation and percussion. Heart: Normal S1 and S2.  Pos S4  Pulses: Pulses normal in all 4 extremities. Extremities: No clubbing or cyanosis. No edema.  Wrist looks good.   Neurologic: Alert and oriented x 3.    Lab Results:  Basename 03/03/12 0454 03/02/12 2017  WBC 8.6 6.7  HGB 14.6 14.5  PLT 140* 127*    Basename 03/03/12 0454 03/02/12 1400  NA 142 133*  K 3.5 3.5  CL 105 99  CO2 26 24  GLUCOSE 138* 275*  BUN 5* 6  CREATININE 0.80 0.73    Basename 03/03/12 0454 03/02/12 2018  TROPONINI >20.00* >20.00*   Hepatic Function Panel  Basename 03/02/12 1400  PROT 6.0  ALBUMIN 3.7  AST 95*  ALT 32  ALKPHOS 68  BILITOT 0.5  BILIDIR --  IBILI --    Basename 03/03/12 0454  CHOL 147   No results found for this basename: PROTIME in the last 72 hours  Imaging: Dg Chest Port 1 View  03/02/2012  *RADIOLOGY REPORT*  Clinical Data: Shortness of breath, chest pain, myocardial infarction  PORTABLE CHEST - 1 VIEW  Comparison: 10/23/2008  Findings: Unchanged cardiac silhouette and mediastinal contours. Lung volumes are reduced with minimal bibasilar opacities, left greater than right, likely atelectasis.  No definite pleural effusion or pneumothorax.  No definite evidence  of pulmonary edema. Unchanged bones.  IMPRESSION: Decreased lung volumes without acute cardiopulmonary disease.  Original Report Authenticated By: Waynard Reeds, M.D.    EKG:  Evolving inferior MI  Cardiac Studies:  See enzyme data  Assessment/Plan:  1.  Acute inferior MI  --  Sp PCI with tissue prolapse and second stent placement 2.  Paranoid schizophrenia  ---   Followed at [facility]. 3.  Diabetes mellitus  --  Insulin requiring  Plan  1.  Diabetes coordinator 2.  Social work consult 3.  Dual antiplatelet therapy 4.  Saline drops nasal.       [name], MD, [facility], [facility] 03/03/2012, 1:12 PM

## 2012-03-03 NOTE — Care Management Note (Unsigned)
    Page 1 of 2   03/07/2012     1:56:39 PM   CARE MANAGEMENT NOTE 03/07/2012  Patient:  Bruce [name], Bruce [name]   Account Number:  [id number]  Date Initiated:  03/03/2012  Documentation initiated by:  SIMMONS,CRYSTAL  Subjective/Objective Assessment:   ADMITTED WITH STEMI; LIVES AT HOME WITH ALONE; IPTA; USES PHARMACY AT [facility] (VIA MAIL AT NO CHARGE).     Action/Plan:   DISCHARGE PLANNING DISCUSSED WITH PT AT BEDSIDE. PT HAS MEDICARE AND TRICARE INSURANCE.   Anticipated DC Date:  03/05/2012   Anticipated DC Plan:  HOME/SELF CARE      DC Planning Services  CM consult      [place] [name] Choice  HOME HEALTH   Choice offered to / List presented to:  C-1 Patient   DME arranged  Levan Hurst      DME agency  [facility]     [name] arranged  HH-1 RN      [name] agency  [facility]   Status of service:  Completed, signed off Medicare Important Message given?   (If response is "NO", the following Medicare IM given date fields will be blank) Date Medicare IM given:   Date Additional Medicare IM given:    Discharge Disposition:  HOME W HOME HEALTH SERVICES  Per UR Regulation:  Reviewed for med. necessity/level of care/duration of stay  If discussed at Long Length of Stay Meetings, dates discussed:    Comments:  03/07/12 Bruce Estell,RN,BSN [phone] RECEIVED WORD FROM TIM [name] WITH [facility] THAT PT IS NOT ELIGIBLE FOR [facility] FOLLOW UP, AS HE DOES NOT HAVE A [facility] PCP AND HIS Bruce ISSUES ARE BEYOND THE SCOPE OF PRACTICE. CHECKED [facility] COPAY FOR PT:  ESTIMATED COPAY FOR BRILINTA AT CURRENT DOSE IS $17.  WILL NOTIFY PT.  03-07-12 12:10pm Bruce [name], RNBSN [phone] Talked with physician and pateint.  Physician wants [name] RN set up for followup of meds.  States has had in the past - with different agencies, doesn't matter who.  Agreeable to Bruce [name] referral - Bruce Henderson RN notified.  Set up with Bruce [name] - Referral made.  Bruce [name]/u being set up by SW.  03/03/12  1439   CRYSTAL SIMMONS RN, BSN [phone] NCM WILL FOLLOW.

## 2012-03-04 DIAGNOSIS — I2119 ST elevation (STEMI) myocardial infarction involving other coronary artery of inferior wall: Principal | ICD-10-CM

## 2012-03-04 LAB — BASIC METABOLIC PANEL
CO2: 25 mEq/L (ref 19–32)
Calcium: 9.8 mg/dL (ref 8.4–10.5)
Glucose, Bld: 195 mg/dL — ABNORMAL HIGH (ref 70–99)
Potassium: 3.5 mEq/L (ref 3.5–5.1)
Sodium: 136 mEq/L (ref 135–145)

## 2012-03-04 LAB — GLUCOSE, CAPILLARY
Glucose-Capillary: 108 mg/dL — ABNORMAL HIGH (ref 70–99)
Glucose-Capillary: 126 mg/dL — ABNORMAL HIGH (ref 70–99)

## 2012-03-04 MED ORDER — DIAZEPAM 5 MG PO TABS
ORAL_TABLET | ORAL | Status: AC
  Administered 2012-03-04: 2 mg via ORAL
  Filled 2012-03-04: qty 1

## 2012-03-04 MED ORDER — POTASSIUM CHLORIDE CRYS ER 20 MEQ PO TBCR
40.0000 meq | EXTENDED_RELEASE_TABLET | Freq: Once | ORAL | Status: AC
  Administered 2012-03-04: 40 meq via ORAL
  Filled 2012-03-04: qty 2

## 2012-03-04 NOTE — Progress Notes (Signed)
Subjective:  Greatly appreciate help of social work.  This issue is complex.  Patient's relationship with [facility] and psych is uncertain.  He has nobody here.  He is stable from a cardiac standpoint, and yet I do not yet have confidence that he will be safe without a comprehensive home plan.  He also has had trouble getting follow up at the [place] per the patient.  He gets his meds there as well.  Complains of some LLQ discomfort  (for several months)  Objective:  Vital Signs in the last 24 hours: Temp:  [97.6 F (36.4 C)-98.7 F (37.1 C)] 98 F (36.7 C) (06/18 0745) Pulse Rate:  [60-79] 79  (06/18 0601) Resp:  [14-28] 28  (06/18 0745) BP: (107-125)/(66-90) 122/82 mmHg (06/18 0745) SpO2:  [95 %-100 %] 97 % (06/18 0745)  Intake/Output from previous day: 06/17 0701 - 06/18 0700 In: 1020 [P.O.:1020] Out: 2200 [Urine:2200]   Physical Exam: General: Well developed, well nourished, in no acute distress. Head:  Normocephalic and atraumatic. Lungs: Clear to auscultation and percussion. Heart: Normal S1 and S2.  No murmur, rubs or gallops.  Pulses: Pulses normal in all 4 extremities. Extremities: No clubbing or cyanosis. No edema. Neurologic: Alert and oriented x 3. Abd:  No masses or tenderness felt.      Lab Results:  Basename 03/03/12 0454 03/02/12 2017  WBC 8.6 6.7  HGB 14.6 14.5  PLT 140* 127*    Basename 03/03/12 0454 03/02/12 1400  NA 142 133*  K 3.5 3.5  CL 105 99  CO2 26 24  GLUCOSE 138* 275*  BUN 5* 6  CREATININE 0.80 0.73    Basename 03/03/12 0454 03/02/12 2018  TROPONINI >20.00* >20.00*   Hepatic Function Panel  Basename 03/02/12 1400  PROT 6.0  ALBUMIN 3.7  AST 95*  ALT 32  ALKPHOS 68  BILITOT 0.5  BILIDIR --  IBILI --    Basename 03/03/12 0454  CHOL 147   No results found for this basename: PROTIME in the last 72 hours  Imaging: Dg Chest Port 1 View  03/02/2012  *RADIOLOGY REPORT*  Clinical Data: Shortness of breath, chest pain, myocardial  infarction  PORTABLE CHEST - 1 VIEW  Comparison: 10/23/2008  Findings: Unchanged cardiac silhouette and mediastinal contours. Lung volumes are reduced with minimal bibasilar opacities, left greater than right, likely atelectasis.  No definite pleural effusion or pneumothorax.  No definite evidence of pulmonary edema. Unchanged bones.  IMPRESSION: Decreased lung volumes without acute cardiopulmonary disease.  Original Report Authenticated By: Waynard Reeds, M.D.    EKG:  Inferior MI.   Assessment/Plan:  1.  Inferior MI, Stable.  SP DES times 2 to the RCA.  ON DAPT.  2.  Schizophrenia  ---   Not on meds.  Psych consult called. 3.  DM  --  Seen by coordinator.  Continuing current protocol as noted. 4.  Social Work--appreciate efforts of [place].  I will call today to speak with the Daughter in [place].  Hopefully they will come.  Biggest issue will be arranging [facility] follow up and ensuring adequate home care.         [name], MD, [facility], FSCAI 03/04/2012, 12:03 PM

## 2012-03-04 NOTE — Progress Notes (Signed)
CARDIAC REHAB PHASE I   PRE:  Rate/Rhythm: 85 SR  BP:  Supine:   Sitting: 105/74  Standing: 106/55   SaO2: 100 RA  MODE:  Ambulation: 270 ft   POST:  Rate/Rhythem: 98 SR  BP:  Supine:   Sitting: 111/70  Standing:    SaO2: 98 RA 1445-1505 Assisted X 2 to ambulate. Gait unsteady, legs buckled several times. Pt refused to use walker. States that he has a cane at home but does not use it.Would recommend Physical Therapy consult to assess discharge needs and to help with strengthening. Pt to recliner after walk with call light in reach.  Bruce [name]

## 2012-03-04 NOTE — Progress Notes (Signed)
Clinical Social Worker followed up with patient today and he stated that he needed to talk. Patient stated that he felt like he was in a depression but nothing specific is triggering that feeling. During discussion, he was talking to one of the voices in his head, who told him not to discuss further on the topic we were discussing. Patient discussed how he would have times where he would not know who he is or where he was and the most recent time it happened was two days before his current admission. Patient stated that he saw a woman in his room, whom he did not know, standing in the corner, and he stated that when this happens, "I'm on the decline."  CSW continued to provided emotional support to patient. CSW spoke with patient's daughter, Suzette Battiest [phone]) who is in [place]. Suzette Battiest is trying to work out with her brother a plan to come down to be with the patient. CSW emailed [place] a list of private duty agencies who may be able to assist in the interim, until [place] and her brother are able to get transportation. CSW will continue to follow.  Rozetta Nunnery MSW, [employer] [phone]

## 2012-03-05 DIAGNOSIS — F4310 Post-traumatic stress disorder, unspecified: Secondary | ICD-10-CM | POA: Diagnosis present

## 2012-03-05 DIAGNOSIS — I219 Acute myocardial infarction, unspecified: Secondary | ICD-10-CM

## 2012-03-05 LAB — GLUCOSE, CAPILLARY
Glucose-Capillary: 124 mg/dL — ABNORMAL HIGH (ref 70–99)
Glucose-Capillary: 123 mg/dL — ABNORMAL HIGH (ref 70–99)

## 2012-03-05 MED ORDER — MAGNESIUM HYDROXIDE 400 MG/5ML PO SUSP: 15.0000 mL | Freq: Every day | ORAL | Status: DC | PRN

## 2012-03-05 MED ORDER — TICAGRELOR 90 MG PO TABS
90.0000 mg | ORAL_TABLET | Freq: Two times a day (BID) | ORAL | Status: DC
  Administered 2012-03-05 – 2012-03-07 (×5): 90 mg via ORAL
  Filled 2012-03-05 (×6): qty 1

## 2012-03-05 NOTE — Progress Notes (Signed)
Patient had several episodes today where affect changed in less than a minute.  Pt stated "what did you do to him?" "We are here to protect him" and "We are his family". Tone of voice was angry and accusatory.  When asked who I was speaking to, pt responded "We are his family, we don't need a name."  After a few minutes, pt affect changed again back to "Bruce [name]".  Pt stated had no idea what had just happened.  Afterwards, pt was back to baseline( alert, oriented, pleasant).

## 2012-03-05 NOTE — Progress Notes (Signed)
CARDIAC REHAB PHASE I   PRE:  Rate/Rhythm: 85SR  BP:  Supine:   Sitting: 123/73  Standing:    SaO2:   MODE:  Ambulation: 150 ft   POST:  Rate/Rhythem: 102ST  BP:  Supine:   Sitting: 122/82  Standing:    SaO2:  1315-1342 Pt very sleepy and hard to get pt to keep eyes opened. Walked 150 ft on RA with asst x 2 following with chair. We used gait belt because pt stated painful under left breast. Used rolling walker to begin with but pt threw it aside during walk and stood more upright and started to walk faster. Unsteady.  To recliner with call bell. Pt stated starving but tray of lunch untouched, set up tray.  Pt talked about self as if he was another person. What did you give him? He is so hungry.  Duanne Limerick

## 2012-03-05 NOTE — Progress Notes (Signed)
Patient Name: Bruce [name] Date of Encounter: 03/05/2012     Active Problems:  Schizophrenia  PTSD (post-traumatic stress disorder)    SUBJECTIVE  Doing well at present.  Psych saw patient earlier.  No current chest pain.    CURRENT MEDS    . aspirin  81 mg Oral Daily  . atorvastatin  80 mg Oral q1800  . insulin aspart  0-15 Units Subcutaneous TID WC  . insulin aspart protamine-insulin aspart  15 Units Subcutaneous Q breakfast  . insulin aspart protamine-insulin aspart  20 Units Subcutaneous Q supper  . metoprolol tartrate  12.5 mg Oral BID  . sodium chloride  3 mL Intravenous Q12H  . Tamsulosin HCl  0.4 mg Oral Daily  . Ticagrelor  90 mg Oral BID  . DISCONTD: Ticagrelor  90 mg Oral BID    OBJECTIVE  Filed Vitals:   03/05/12 0800 03/05/12 1000 03/05/12 1200 03/05/12 1548  BP: 119/83 120/75 111/71 118/79  Pulse: 84  85 94  Temp: 98.4 F (36.9 C)  98.4 F (36.9 C) 97.7 F (36.5 C)  TempSrc: Oral  Oral Oral  Resp: 20 24 20 20   Height:      Weight:      SpO2: 99%  98% 99%    Intake/Output Summary (Last 24 hours) at 03/05/12 1848 Last data filed at 03/05/12 1200  Gross per 24 hour  Intake    780 ml  Output    200 ml  Net    580 ml   Filed Weights   03/02/12 1351 03/03/12 0600  Weight: 188 lb 15 oz (85.7 kg) 175 lb 7.8 oz (79.6 kg)    PHYSICAL EXAM  General: Pleasant, NAD. Neuro: Alert and oriented X 3. Moves all extremities spontaneously. Psych: Normal affect. HEENT:  Normal  Neck: Supple without bruits or JVD. Lungs:  Resp regular and unlabored, CTA. Heart: RRR no s3, s4, or murmurs. Abdomen: Soft, non-tender, non-distended, BS + x 4.  Extremities: No clubbing, cyanosis or edema. DP/PT/Radials 2+ and equal bilaterally.  Accessory Clinical Findings  CBC  Basename 03/03/12 0454 03/02/12 2017  WBC 8.6 6.7  NEUTROABS -- --  HGB 14.6 14.5  HCT 41.8 39.9  MCV 91.1 90.1  PLT 140* 127*   Basic Metabolic Panel  Basename 03/04/12 2007  03/03/12 0454  NA 136 142  K 3.5 3.5  CL 100 105  CO2 25 26  GLUCOSE 195* 138*  BUN 9 5*  CREATININE 0.90 0.80  CALCIUM 9.8 9.4  MG -- --  PHOS -- --   Liver Function Tests No results found for this basename: AST:2,ALT:2,ALKPHOS:2,BILITOT:2,PROT:2,ALBUMIN:2 in the last 72 hours No results found for this basename: LIPASE:2,AMYLASE:2 in the last 72 hours Cardiac Enzymes  Basename 03/03/12 0454 03/02/12 2018  CKTOTAL 592* 1067*  CKMB 39.9* 80.1*  CKMBINDEX -- --  TROPONINI >20.00* >20.00*   BNP No components found with this basename: POCBNP:3 D-Dimer No results found for this basename: DDIMER:2 in the last 72 hours Hemoglobin A1C No results found for this basename: HGBA1C in the last 72 hours Fasting Lipid Panel  Basename 03/03/12 0454  CHOL 147  HDL 26*  LDLCALC 82  TRIG 478*  CHOLHDL 5.7  LDLDIRECT --   Thyroid Function Tests No results found for this basename: TSH,T4TOTAL,FREET3,T3FREE,THYROIDAB in the last 72 hours    Radiology/Studies  Dg Chest Port 1 View  03/02/2012  *RADIOLOGY REPORT*  Clinical Data: Shortness of breath, chest pain, myocardial infarction  PORTABLE CHEST - 1  VIEW  Comparison: 10/23/2008  Findings: Unchanged cardiac silhouette and mediastinal contours. Lung volumes are reduced with minimal bibasilar opacities, left greater than right, likely atelectasis.  No definite pleural effusion or pneumothorax.  No definite evidence of pulmonary edema. Unchanged bones.  IMPRESSION: Decreased lung volumes without acute cardiopulmonary disease.  Original Report Authenticated By: Waynard Reeds, M.D.    ASSESSMENT AND PLAN  1.  SP inferior wall MI  Currently stable 2.  Schizophrenia 3.  IDDM  Plan  1.  Observe one or two more days.  2.  Consider transfer to behavioral health' 3.  Continue meds.    Signed, [name] MD, [facility], FSCAI
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
## 2012-03-05 NOTE — Progress Notes (Signed)
Clinical Social Worker saw note in system and spoke with patient's daughter, Suzette Battiest. Suzette Battiest is concerned about patient being discharged without some rehab first. CSW informed her of the PT/OT evaluation and will await their recommendations.  Rozetta Nunnery MSW, [employer] [phone]

## 2012-03-06 ENCOUNTER — Encounter (HOSPITAL_COMMUNITY): Payer: Self-pay | Admitting: Physician Assistant

## 2012-03-06 DIAGNOSIS — I10 Essential (primary) hypertension: Secondary | ICD-10-CM | POA: Diagnosis present

## 2012-03-06 DIAGNOSIS — I2119 ST elevation (STEMI) myocardial infarction involving other coronary artery of inferior wall: Secondary | ICD-10-CM | POA: Diagnosis present

## 2012-03-06 LAB — GLUCOSE, CAPILLARY: Glucose-Capillary: 194 mg/dL — ABNORMAL HIGH (ref 70–99)

## 2012-03-06 MED ORDER — LORAZEPAM 2 MG/ML IJ SOLN
INTRAMUSCULAR | Status: AC
  Filled 2012-03-06: qty 1

## 2012-03-06 MED ORDER — LURASIDONE HCL 40 MG PO TABS
40.0000 mg | ORAL_TABLET | Freq: Every day | ORAL | Status: DC
  Administered 2012-03-06 – 2012-03-07 (×2): 40 mg via ORAL
  Filled 2012-03-06 (×5): qty 1

## 2012-03-06 MED ORDER — LURASIDONE HCL 40 MG PO TABS: 40.0000 mg | ORAL_TABLET | Freq: Every day | ORAL | Status: DC

## 2012-03-06 MED ORDER — LORAZEPAM 2 MG/ML IJ SOLN
2.0000 mg | Freq: Once | INTRAMUSCULAR | Status: AC
  Administered 2012-03-06: 2 mg via INTRAVENOUS
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
## 2012-03-06 NOTE — Progress Notes (Signed)
Called by RN regarding patient's agitation, hallucinations and aggression prompting security assistance. The patient was able to be calmed and became more lucid in his thought processes. Gave order for Ativan IV and bedside sitter. He is currently calm and resting. Advised to contact regarding further agitation, in addition to psychiatry MD on call for further recommendations.    Jacqulyn Bath, PA-C 03/06/2012 9:33 PM

## 2012-03-06 NOTE — Progress Notes (Signed)
Patient very agitated after learning that the d/c plan was for him to go to [facility] for monitoring on new psych med.  Since then, patient has been irritable, non-compliant, and refusing to let staff replace the telemetry leads and box that he removed. Notified PA. See orders received. Harlow Asa

## 2012-03-06 NOTE — Progress Notes (Signed)
Pt refusing to wear telemetry box. Tried explaining to him the importance of wearing the telemetry in order for [place] to monitor his heart rate. Pt was upset about his plan for discharge. Consulting civil engineer and monitor tech aware. PA wrote order for discontinuation of telemetry. Bruce [name]

## 2012-03-06 NOTE — Progress Notes (Signed)
Patient Name: Bruce [name] Date of Encounter: 03/06/2012  Principal Problem:  *ST elevation myocardial infarction (STEMI) of inferior wall, initial episode of care Active Problems:  Schizophrenia  PTSD (post-traumatic stress disorder)  Diabetes mellitus  Hypertension   SUBJECTIVE: No chest pain or SOB. Ambulating without difficulty. Lives alone and concerned about the timing of his discharge.  OBJECTIVE Filed Vitals:   03/05/12 1200 03/05/12 1548 03/05/12 2015 03/06/12 0353  BP: 111/71 118/79 126/83 128/86  Pulse: 85 94 82 79  Temp: 98.4 F (36.9 C) 97.7 F (36.5 C) 97.4 F (36.3 C) 98.1 F (36.7 C)  TempSrc: Oral Oral Oral Oral  Resp: 20 20 20 18   Height:      Weight:      SpO2: 98% 99% 99% 100%    Intake/Output Summary (Last 24 hours) at 03/06/12 0803 Last data filed at 03/06/12 0354  Gross per 24 hour  Intake    480 ml  Output    401 ml  Net     79 ml   Weight change:  Filed Weights   03/02/12 1351 03/03/12 0600  Weight: 188 lb 15 oz (85.7 kg) 175 lb 7.8 oz (79.6 kg)     PHYSICAL EXAM General: Well developed, well nourished, male in no acute distress. Head: Normocephalic, atraumatic.  Neck: Supple without bruits, JVD not elevated. Lungs:  Resp regular and unlabored, CTA. Heart: RRR, S1, S2, no S3, S4, or murmur. Abdomen: Soft, non-tender, non-distended, BS + x 4.  Extremities: No clubbing, cyanosis, no edema.  Neuro: Alert and oriented X 3. Moves all extremities spontaneously. Psych: Animated, talkative.  LABS: Basic Metabolic Panel: Basename 03/04/12 2007  NA 136  K 3.5  CL 100  CO2 25  GLUCOSE 195*  BUN 9  CREATININE 0.90  CALCIUM 9.8  MG --  PHOS --   TELE:   SR   Radiology/Studies: Dg Chest Port 1 View 03/02/2012  *RADIOLOGY REPORT*  Clinical Data: Shortness of breath, chest pain, myocardial infarction  PORTABLE CHEST - 1 VIEW  Comparison: 10/23/2008  Findings: Unchanged cardiac silhouette and mediastinal contours. Lung volumes  are reduced with minimal bibasilar opacities, left greater than right, likely atelectasis.  No definite pleural effusion or pneumothorax.  No definite evidence of pulmonary edema. Unchanged bones.  IMPRESSION: Decreased lung volumes without acute cardiopulmonary disease.  Original Report Authenticated By: Waynard Reeds, M.D.    Current Medications:     . aspirin  81 mg Oral Daily  . atorvastatin  80 mg Oral q1800  . insulin aspart  0-15 Units Subcutaneous TID WC  . insulin aspart protamine-insulin aspart  15 Units Subcutaneous Q breakfast  . insulin aspart protamine-insulin aspart  20 Units Subcutaneous Q supper  . metoprolol tartrate  12.5 mg Oral BID  . sodium chloride  3 mL Intravenous Q12H  . Tamsulosin HCl  0.4 mg Oral Daily  . Ticagrelor  90 mg Oral BID  . DISCONTD: Ticagrelor  90 mg Oral BID      ASSESSMENT AND PLAN: Principal Problem:  *ST elevation myocardial infarction (STEMI) of inferior wall, initial episode of care - s/p DES RCA with med Rx for residual CFX dz. Continue ASA/BB/statin/ticagrelor, poss d/c in am.  Active Problems:  Schizophrenia/ PTSD (post-traumatic stress disorder) - Seen by Dr Ferol Luz yesterday, will add Latuda as recommended. She is following and will look into [facility] transfer at discharge   Diabetes mellitus - Home insulin decreased (?overstated by pt on admit?), blood sugars better  controlled.   Hypertension - Good control on current Rx.  PLAN: Hopefully d/c to [facility] in am.  Signed, Theodore Demark , PA-C 8:03 AM 03/06/2012  Seen and agree.  Awaiting for word on transfer.  Patient is doing well from a cardiac standpoint.  He will need OP follow up in cardiology.  Patient is informed.

## 2012-03-07 ENCOUNTER — Encounter (HOSPITAL_COMMUNITY): Payer: Self-pay | Admitting: Cardiology

## 2012-03-07 DIAGNOSIS — F200 Paranoid schizophrenia: Secondary | ICD-10-CM

## 2012-03-07 DIAGNOSIS — F4310 Post-traumatic stress disorder, unspecified: Secondary | ICD-10-CM

## 2012-03-07 LAB — GLUCOSE, CAPILLARY: Glucose-Capillary: 132 mg/dL — ABNORMAL HIGH (ref 70–99)

## 2012-03-07 MED ORDER — POTASSIUM CHLORIDE CRYS ER 20 MEQ PO TBCR
40.0000 meq | EXTENDED_RELEASE_TABLET | Freq: Once | ORAL | Status: AC
  Administered 2012-03-07: 40 meq via ORAL
  Filled 2012-03-07: qty 2

## 2012-03-07 MED ORDER — LURASIDONE HCL 40 MG PO TABS: 40.0000 mg | ORAL_TABLET | Freq: Every day | ORAL | Status: DC

## 2012-03-07 MED ORDER — METOPROLOL TARTRATE 25 MG PO TABS: 25.0000 mg | ORAL_TABLET | Freq: Two times a day (BID) | ORAL | Status: DC

## 2012-03-07 MED ORDER — ATORVASTATIN CALCIUM 80 MG PO TABS: 80.0000 mg | ORAL_TABLET | Freq: Every day | ORAL | Status: DC

## 2012-03-07 MED ORDER — NITROGLYCERIN 0.4 MG SL SUBL: 0.4000 mg | SUBLINGUAL_TABLET | SUBLINGUAL | Status: DC | PRN

## 2012-03-07 MED ORDER — INSULIN NPH ISOPHANE & REGULAR (70-30) 100 UNIT/ML ~~LOC~~ SUSP: 15.0000 [IU] | Freq: Two times a day (BID) | SUBCUTANEOUS | Status: DC

## 2012-03-07 MED ORDER — TICAGRELOR 90 MG PO TABS: 90.0000 mg | ORAL_TABLET | Freq: Two times a day (BID) | ORAL | Status: DC

## 2012-03-07 MED ORDER — ASPIRIN 81 MG PO CHEW: 81.0000 mg | CHEWABLE_TABLET | Freq: Every day | ORAL | Status: DC

## 2012-03-07 MED ORDER — METOPROLOL TARTRATE 25 MG PO TABS
25.0000 mg | ORAL_TABLET | Freq: Two times a day (BID) | ORAL | Status: DC
  Filled 2012-03-07: qty 1

## 2012-03-07 NOTE — Consult Note (Signed)
Updated regarding events that happened last night per Psych MD and patient now having a sitter at the bedside due to explosive miscommunication causing patient to become very agitated and upset with regards to incorrect information about disposition.  Patient at this time is asleep and per his report and nursing not allowed to be touched while sleeping due to PTSD and CIRT risk.  It is reported that MD to MD was not clarified per Psych MD and that patient thought he was going to be placed inpatient at [facility] or another psych facility against his will.  Patient was educated by CSW and Psych MD that he would be going home with outpatient follow up and appointment in which CSW has arranged, and then when MD came in to explain disposition patient became very angry and upset.  This am patient was again tried to been seen. [facility] was contacted with regards that patient is still in hospital and not yet dc.  Intake coordinator reports she would like to set up the appointment on 03/08/2012 for initial assessment and begin services.  It was explained that patient was still in hospital, will have lunch in the next hour and phone call can be placed around that time with regards to appointment and time.  If patient cannot make appointment, then it can be rescheduled per Rodney Booze at [facility].   Will updated Psych MD, Attending MD and treatment team.  ,Ashley Jacobs, MSW LCSW [phone]
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
## 2012-03-07 NOTE — Progress Notes (Signed)
Physical Therapy Treatment Patient Details Name: Bruce [name] MRN: [id number] DOB: May 16, 1946 Today's Date: 03/07/2012 Time: 0865-7846 PT Time Calculation (min): 16 min  PT Assessment / Plan / Recommendation Comments on Treatment Session  Pt s/p STEMI with improved gait today but continues to need cues for safe RW use. Pt very pleasant and eager to return home. Pt states he will use RW for mobility due to fall risk.     Follow Up Recommendations       Barriers to Discharge        Equipment Recommendations       Recommendations for Other Services    Frequency     Plan Discharge plan remains appropriate;Frequency remains appropriate    Precautions / Restrictions Precautions Precautions: Fall   Pertinent Vitals/Pain No pain    Mobility  Bed Mobility Bed Mobility: Not assessed Transfers Transfers: Sit to Stand;Stand to Sit Sit to Stand: 5: Supervision Stand to Sit: 6: Modified independent (Device/Increase time) Details for Transfer Assistance: cueing for hand placement not to pull up on RW Ambulation/Gait Ambulation/Gait Assistance: 5: Supervision Ambulation Distance (Feet): 650 Feet Assistive device: Rolling walker Ambulation/Gait Assistance Details: cueing to step into RW and relax shoulders. Pt with much more controlled and smooth gait today with one partial LOB without assist to correct Gait Pattern: Step-through pattern Gait velocity: decreased    Exercises     PT Diagnosis:    PT Problem List:   PT Treatment Interventions:     PT Goals Acute Rehab PT Goals PT Goal: Sit to Stand - Progress: Progressing toward goal PT Goal: Stand to Sit - Progress: Progressing toward goal PT Goal: Ambulate - Progress: Progressing toward goal PT Goal: Up/Down Stairs - Progress: Progressing toward goal  Visit Information  Last PT Received On: 03/07/12 Assistance Needed: +1    Subjective Data  Subjective: everyone has been so helpful   Cognition  Overall Cognitive  Status: History of cognitive impairments - at baseline Arousal/Alertness: Awake/alert Orientation Level: Appears intact for tasks assessed Behavior During Session: [facility] for tasks performed    Balance     End of Session PT - End of Session Activity Tolerance: Patient tolerated treatment well Patient left: in chair;with call bell/phone within reach Nurse Communication: Mobility status    Delorse Lek 03/07/2012, 4:00 PM Delaney Meigs, PT [phone]

## 2012-03-07 NOTE — Progress Notes (Signed)
Subjective:  Patient stable.  No chest pain.  Ambulating without difficulty.  Psych says he is ready from their standpoint, and the patient does not agree with [facility] admission---see notes.  I have personally spoken with transition nurses, personal nurse, progression nurse, case management regarding getting arrangements in place given the potential, with new meds, new insulin dosing, and OP care at the [facility] system.  I have also called his daughter Suzette Battiest who is in [place] (and tied down with a sick mother in law).  The patient says his fiance is coming from [place] to help him.  He is pain free.  I have reviewed dos and donts with the patient.    Objective:  Vital Signs in the last 24 hours: Temp:  [97.7 F (36.5 C)-98.4 F (36.9 C)] 98.4 F (36.9 C) (06/21 0415) Pulse Rate:  [79-90] 79  (06/21 0415) Resp:  [18] 18  (06/21 0415) BP: (120-126)/(81-90) 120/81 mmHg (06/21 0415) SpO2:  [97 %-100 %] 98 % (06/21 0415)  Intake/Output from previous day: 06/20 0701 - 06/21 0700 In: 480 [P.O.:480] Out: 2 [Urine:1; Stool:1]   Physical Exam: General: Well developed, well nourished, in no acute distress. Head:  Normocephalic and atraumatic. Lungs: Clear to auscultation and percussion. Heart: Normal S1 and S2.  No murmur, rubs or gallops.  Pulses: Pulses normal in all 4 extremities. Extremities: No clubbing or cyanosis. No edema. Neurologic: Alert and oriented x 3.    Lab Results: No results found for this basename: WBC:2,HGB:2,PLT:2 in the last 72 hours  Basename 03/04/12 2007  NA 136  K 3.5  CL 100  CO2 25  GLUCOSE 195*  BUN 9  CREATININE 0.90   No results found for this basename: TROPONINI:2,CK,MB:2 in the last 72 hours Hepatic Function Panel No results found for this basename: PROT,ALBUMIN,AST,ALT,ALKPHOS,BILITOT,BILIDIR,IBILI in the last 72 hours No results found for this basename: CHOL in the last 72 hours No results found for this basename: PROTIME in the last 72  hours  Imaging: No results found.  EKG:  6/18  Inferior MI  Cardiac Studies:  See cath data.   Assessment/Plan:  Patient Active Hospital Problem List: ST elevation myocardial infarction (STEMI) of inferior wall, initial episode of care (03/06/2012)   Assessment: clinically stable and ready for discharge.    Plan: possibly home today.  No driving.  DAPT for one year.  Need reviewed.  Needs follow up with me in two weeks.   Schizophrenia (03/05/2012)   Assessment: seen by psychiatry and OP arranged as well as new medication   Plan: see notes  Diabetes mellitus ()   Assessment: better on current dosing   Plan: monitor sugars More than one hour of chart, phone calls, supportive care team interaction, speaking with family.       [name], MD, [facility], FSCAI 03/07/2012, 12:14 PM

## 2012-03-07 NOTE — Progress Notes (Signed)
Pt discharge instructions and education complete. IV d/c. Site WNL. Prescriptions given to patient. [facility] appt. Set for tomorrow. Pt in agreement. Taxi called for d/c. Dion Saucier

## 2012-03-07 NOTE — Progress Notes (Signed)
Thank you to Avie Arenas RN CM for this referral.  Patient is not meet service criteria for [facility] Care Management services at this time. For any additional questions or new referrals please contact Anibal Henderson BSN RN [facility] Liaison at [phone].

## 2012-03-08 ENCOUNTER — Telehealth: Payer: Self-pay | Admitting: Physician Assistant

## 2012-03-08 NOTE — Progress Notes (Signed)
Pt progress note.  A final visit before discharge.  Pt is awake alert, smiling and cognitively intact.  He denies any thought/plan of suicide.  He is informed that he will be discharge to home for outpatient care at [facility].  He gives permission for MD to contact the OP Psychiatrist.  Dr. Riley Kill is present.   Pt is seen ~ 11:00 am 6.21.13  Tanayah Squitieri J. Ferol Luz, MD Psychiatrist  03/08/2012 12:25 AM

## 2012-03-08 NOTE — Progress Notes (Signed)
CARE MANAGEMENT NOTE 03/08/2012  Patient:  Bruce [name], Bruce [name]   Account Number:  [id number]  Date Initiated:  03/03/2012  Documentation initiated by:  SIMMONS,CRYSTAL  Subjective/Objective Assessment:   ADMITTED WITH STEMI; LIVES AT HOME WITH ALONE; IPTA; USES PHARMACY AT [facility] (VIA MAIL AT NO CHARGE).     Action/Plan:   DISCHARGE PLANNING DISCUSSED WITH PT AT BEDSIDE. PT HAS MEDICARE AND TRICARE INSURANCE.   Anticipated DC Date:  03/05/2012   Anticipated DC Plan:  HOME/SELF CARE      DC Planning Services  CM consult      [facility] Choice  HOME HEALTH   Choice offered to / List presented to:  C-1 Patient   DME arranged  Levan Hurst      DME agency  [facility]     [facility] arranged  HH-1 RN      [facility] agency  [facility]   Status of service:  Completed, signed off Medicare Important Message given?   (If response is "NO", the following Medicare IM given date fields will be blank) Date Medicare IM given:   Date Additional Medicare IM given:    Discharge Disposition:  HOME W HOME HEALTH SERVICES  Per UR Regulation:  Reviewed for med. necessity/level of care/duration of stay  If discussed at Long Length of Stay Meetings, dates discussed:    Comments:  03/08/2012 1400 Spoke to [employer] PA, and pt was d/c. PA spoke to pt and states he cannot get his medications. He took his prescription to [facility] # [phone] and they did not have medication. He would have to pay $1100. Reviewed insurance and does not qualify for [employer] med assistance fund. Contacted pt and states [facility] did not have Brilinta at all. He has free 30 day free supply card for Brilinta. And that he has Tricare for his meds and gets meds from [place]. Patient requested NCM contact [facility] on [address] for medication. Explained to pt that NCM will contact his second choice [facility] [address] # [phone]. Contacted [facility], spoke to pharmacist. They do have Brilinta in stock. His out of  pocket for Lopressor is $4 and Lipitor $80 for 30 days. NCM notified pt that pharmacy will need insurance info to run benefits. Gave him price for out of pocket costs and explained to him to use his 30 day free trial card for Brilinta. . States he does not have insurance card, but has ID card. Explain ID card should have info about coverage on back or he will need to follow up with [employer] in regards to his benefits. Isidoro Donning RN CCM Case Mgmt phone [phone]  03/08/2012 1530 Contacted [facility]. Spoke to pharmacist and they only need his SS # to run benefits for pharmacy with [employer] for Life. Faxed Brilinta Rx to pharmacy and will check benefits. Spoke to pharmacist. They are not showing him covered by Tricare. Spoke to pt and made him aware. States he was in route to [place] as we spoke to pick up medication. States he has his ID card with him. NCM explained to show pharmacist his ID. Isidoro Donning RN CCM Case Mgmt phone [phone]   03/07/12 JULIE AMERSON,RN,BSN [phone] RECEIVED WORD FROM TIM HENDERSON WITH [facility] THAT PT IS NOT ELIGIBLE FOR [facility] FOLLOW UP, AS HE DOES NOT HAVE A [facility] PCP AND HIS PSYCH ISSUES ARE BEYOND THE SCOPE OF PRACTICE. CHECKED BRILINTA COPAY FOR PT:  ESTIMATED COPAY FOR BRILINTA AT CURRENT DOSE  IS $17.  WILL NOTIFY PT.  03-07-12 12:10pm Avie Arenas, RNBSN [phone] Talked with physician and pateint.  Physician wants [facility] RN set up for followup of meds.  States has had in the past - with different agencies, doesn't matter who.  Agreeable to [facility] referral - Anibal Henderson RN notified.  Set up with [facility] - Referral made.  Psych [name]/u being set up by [facility].  03/03/12  1439  CRYSTAL SIMMONS RN, BSN [phone] NCM WILL FOLLOW.

## 2012-03-08 NOTE — Telephone Encounter (Signed)
Patient contacted me today regarding his discharge medication prescriptions, which he was not able to have filled yesterday, following discharge from this hospital. Notably, he was discharged on Brilinta, following treatment with drug-eluting stenting. 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 When he went to a local [facility] pharmacy yesterday, he was told that his health insurance plan would not cover these medications, and that he would have to pay "out of pocket". He cited a cost of $1200. He also suggested that he was not able to receive any free medications, on the day of discharge.  I initially contacted [facility] pharmacy to verify that under his insurance plan these medications would not be covered, and was told that that was the case. I then contacted Case Management here at [facility], and gave them the information regarding this patient. I also provided them prescriptions for a 30 day supply of Brilinta, as well as a 3 day supply of Brilinta, Lopressor, and Lipitor. Case management was going to contact the patient, and have him come to the hospital to fill out the necessary paperwork, and obtain the short-term supply of medications. Subsequently, I suggested to him to obtain all future medications from the [place] clinic in [place], as he has done in the past.

## 2012-03-09 DIAGNOSIS — I10 Essential (primary) hypertension: Secondary | ICD-10-CM

## 2012-03-09 DIAGNOSIS — I2119 ST elevation (STEMI) myocardial infarction involving other coronary artery of inferior wall: Secondary | ICD-10-CM

## 2012-03-13 ENCOUNTER — Encounter: Payer: Self-pay | Admitting: *Deleted

## 2012-03-14 ENCOUNTER — Ambulatory Visit: Payer: Medicare Other | Admitting: Nurse Practitioner

## 2012-03-16 NOTE — Discharge Summary (Signed)
Complicated hospital admission extended because of untreated psych issues.  See psych note.  I spoke with his daughter from [place] in detail, and she is caring for another sick person at present and cannot get here.  Patient will not agree to inpatient psych care.  See by psych and see plan for OP treatment.  He will follow up at [facility] cardiology.  TS

## 2012-03-16 NOTE — H&P (Signed)
Agree with the above note.  Patient has multiple issues but evidence of acute MI.  Urgent cath is recommended.  TS

## 2012-03-20 ENCOUNTER — Inpatient Hospital Stay (HOSPITAL_COMMUNITY)
Admission: EM | Admit: 2012-03-20 | Discharge: 2012-03-26 | DRG: 251 | Disposition: A | Discharge status: Home / Self Care | Payer: Medicare Other | Attending: Cardiovascular Disease | Admitting: Cardiovascular Disease

## 2012-03-20 ENCOUNTER — Encounter (HOSPITAL_COMMUNITY)
Admission: EM | Disposition: A | Discharge status: Home / Self Care | Payer: Self-pay | Source: Home / Self Care | Attending: Cardiovascular Disease

## 2012-03-20 ENCOUNTER — Ambulatory Visit (HOSPITAL_COMMUNITY): Admit: 2012-03-20 | Payer: Self-pay | Admitting: Cardiovascular Disease

## 2012-03-20 ENCOUNTER — Encounter (HOSPITAL_COMMUNITY): Payer: Self-pay | Admitting: Emergency Medicine

## 2012-03-20 ENCOUNTER — Other Ambulatory Visit: Payer: Self-pay

## 2012-03-20 DIAGNOSIS — Y849 Medical procedure, unspecified as the cause of abnormal reaction of the patient, or of later complication, without mention of misadventure at the time of the procedure: Secondary | ICD-10-CM | POA: Diagnosis present

## 2012-03-20 DIAGNOSIS — Z9114 Patient's other noncompliance with medication regimen: Secondary | ICD-10-CM

## 2012-03-20 DIAGNOSIS — G4733 Obstructive sleep apnea (adult) (pediatric): Secondary | ICD-10-CM | POA: Diagnosis present

## 2012-03-20 DIAGNOSIS — Z955 Presence of coronary angioplasty implant and graft: Secondary | ICD-10-CM

## 2012-03-20 DIAGNOSIS — E876 Hypokalemia: Secondary | ICD-10-CM | POA: Diagnosis not present

## 2012-03-20 DIAGNOSIS — Z91199 Patient's noncompliance with other medical treatment and regimen due to unspecified reason: Secondary | ICD-10-CM

## 2012-03-20 DIAGNOSIS — R339 Retention of urine, unspecified: Secondary | ICD-10-CM | POA: Diagnosis present

## 2012-03-20 DIAGNOSIS — D696 Thrombocytopenia, unspecified: Secondary | ICD-10-CM

## 2012-03-20 DIAGNOSIS — Z9861 Coronary angioplasty status: Secondary | ICD-10-CM

## 2012-03-20 DIAGNOSIS — E785 Hyperlipidemia, unspecified: Secondary | ICD-10-CM | POA: Diagnosis present

## 2012-03-20 DIAGNOSIS — E119 Type 2 diabetes mellitus without complications: Secondary | ICD-10-CM

## 2012-03-20 DIAGNOSIS — I2510 Atherosclerotic heart disease of native coronary artery without angina pectoris: Secondary | ICD-10-CM

## 2012-03-20 DIAGNOSIS — Z91148 Patient's other noncompliance with medication regimen for other reason: Secondary | ICD-10-CM

## 2012-03-20 DIAGNOSIS — I2119 ST elevation (STEMI) myocardial infarction involving other coronary artery of inferior wall: Secondary | ICD-10-CM

## 2012-03-20 DIAGNOSIS — F209 Schizophrenia, unspecified: Secondary | ICD-10-CM

## 2012-03-20 DIAGNOSIS — F4310 Post-traumatic stress disorder, unspecified: Secondary | ICD-10-CM

## 2012-03-20 DIAGNOSIS — I252 Old myocardial infarction: Secondary | ICD-10-CM

## 2012-03-20 DIAGNOSIS — Z9119 Patient's noncompliance with other medical treatment and regimen: Secondary | ICD-10-CM

## 2012-03-20 DIAGNOSIS — I10 Essential (primary) hypertension: Secondary | ICD-10-CM | POA: Diagnosis present

## 2012-03-20 DIAGNOSIS — F200 Paranoid schizophrenia: Secondary | ICD-10-CM

## 2012-03-20 DIAGNOSIS — T82897A Other specified complication of cardiac prosthetic devices, implants and grafts, initial encounter: Principal | ICD-10-CM | POA: Diagnosis present

## 2012-03-20 HISTORY — DX: Patient's other noncompliance with medication regimen: Z91.14

## 2012-03-20 HISTORY — DX: Type 2 diabetes mellitus without complications: E11.9

## 2012-03-20 HISTORY — PX: PERCUTANEOUS CORONARY INTERVENTION-BALLOON ONLY: SHX6014

## 2012-03-20 HISTORY — PX: CARDIAC CATHETERIZATION: SHX172

## 2012-03-20 HISTORY — PX: LEFT HEART CATHETERIZATION WITH CORONARY ANGIOGRAM: SHX5451

## 2012-03-20 HISTORY — DX: Patient's other noncompliance with medication regimen for other reason: Z91.148

## 2012-03-20 LAB — CBC
HCT: 42.2 % (ref 39.0–52.0)
MCHC: 35.5 g/dL (ref 30.0–36.0)
MCV: 90.5 fL (ref 78.0–100.0)
Platelets: 63 10*3/uL — ABNORMAL LOW (ref 150–400)
RBC: 4.31 MIL/uL (ref 4.22–5.81)
RDW: 12.8 % (ref 11.5–15.5)
WBC: 7.8 10*3/uL (ref 4.0–10.5)

## 2012-03-20 LAB — POCT I-STAT, CHEM 8
Calcium, Ion: 1.14 mmol/L (ref 1.13–1.30)
Chloride: 103 mEq/L (ref 96–112)
Creatinine, Ser: 0.9 mg/dL (ref 0.50–1.35)
Glucose, Bld: 221 mg/dL — ABNORMAL HIGH (ref 70–99)
HCT: 43 % (ref 39.0–52.0)
Potassium: 4.2 mEq/L (ref 3.5–5.1)

## 2012-03-20 LAB — DIFFERENTIAL
Eosinophils Relative: 1 % (ref 0–5)
Lymphocytes Relative: 20 % (ref 12–46)
Lymphs Abs: 1.5 10*3/uL (ref 0.7–4.0)
Neutrophils Relative %: 74 % (ref 43–77)

## 2012-03-20 LAB — COMPREHENSIVE METABOLIC PANEL
ALT: 26 U/L (ref 0–53)
Albumin: 4.2 g/dL (ref 3.5–5.2)
Alkaline Phosphatase: 76 U/L (ref 39–117)
Alkaline Phosphatase: 68 U/L (ref 39–117)
BUN: 5 mg/dL — ABNORMAL LOW (ref 6–23)
CO2: 27 mEq/L (ref 19–32)
Chloride: 105 mEq/L (ref 96–112)
Creatinine, Ser: 0.76 mg/dL (ref 0.50–1.35)
GFR calc Af Amer: >90 mL/min (ref >90)
GFR calc non Af Amer: >90 mL/min (ref >90)
Glucose, Bld: 186 mg/dL — ABNORMAL HIGH (ref 70–99)
Potassium: 3.8 mEq/L (ref 3.5–5.1)
Potassium: 3.7 mEq/L (ref 3.5–5.1)
Sodium: 142 mEq/L (ref 135–145)
Total Bilirubin: 0.5 mg/dL (ref 0.3–1.2)
Total Protein: 6.8 g/dL (ref 6.0–8.3)

## 2012-03-20 LAB — CARDIAC PANEL(CRET KIN+CKTOT+MB+TROPI)
CK, MB: 13 ng/mL (ref 0.3–4.0)
Troponin I: 4.49 ng/mL (ref <0.30)

## 2012-03-20 LAB — PROTIME-INR: Prothrombin Time: 13.2 seconds (ref 11.6–15.2)

## 2012-03-20 LAB — APTT: aPTT: 23 seconds — ABNORMAL LOW (ref 24–37)

## 2012-03-20 SURGERY — PERCUTANEOUS CORONARY INTERVENTION-BALLOON ONLY

## 2012-03-20 MED ORDER — TRAMADOL HCL 50 MG PO TABS
50.0000 mg | ORAL_TABLET | Freq: Four times a day (QID) | ORAL | Status: DC | PRN
  Administered 2012-03-21 – 2012-03-25 (×5): 50 mg via ORAL
  Filled 2012-03-20 (×6): qty 1

## 2012-03-20 MED ORDER — LIDOCAINE HCL (PF) 1 % IJ SOLN
INTRAMUSCULAR | Status: AC
  Filled 2012-03-20: qty 30

## 2012-03-20 MED ORDER — BIVALIRUDIN 250 MG IV SOLR
INTRAVENOUS | Status: AC
  Filled 2012-03-20: qty 250

## 2012-03-20 MED ORDER — ASPIRIN 81 MG PO CHEW
CHEWABLE_TABLET | ORAL | Status: AC
  Administered 2012-03-20: 324 mg via ORAL
  Filled 2012-03-20: qty 4

## 2012-03-20 MED ORDER — NITROGLYCERIN 0.4 MG SL SUBL
0.4000 mg | SUBLINGUAL_TABLET | SUBLINGUAL | Status: DC | PRN
  Administered 2012-03-25 (×2): 0.4 mg via SUBLINGUAL
  Filled 2012-03-20 (×3): qty 25

## 2012-03-20 MED ORDER — TICAGRELOR 90 MG PO TABS
ORAL_TABLET | ORAL | Status: AC
  Administered 2012-03-21: 90 mg via ORAL
  Filled 2012-03-20: qty 2

## 2012-03-20 MED ORDER — ACETAMINOPHEN 325 MG PO TABS: 650.0000 mg | ORAL_TABLET | ORAL | Status: DC | PRN

## 2012-03-20 MED ORDER — NITROGLYCERIN 0.2 MG/ML ON CALL CATH LAB
INTRAVENOUS | Status: AC
  Filled 2012-03-20: qty 1

## 2012-03-20 MED ORDER — TICAGRELOR 90 MG PO TABS
90.0000 mg | ORAL_TABLET | Freq: Two times a day (BID) | ORAL | Status: DC
  Administered 2012-03-21 – 2012-03-26 (×11): 90 mg via ORAL
  Filled 2012-03-20 (×12): qty 1

## 2012-03-20 MED ORDER — ASPIRIN 81 MG PO CHEW
324.0000 mg | CHEWABLE_TABLET | Freq: Once | ORAL | Status: AC
  Administered 2012-03-20: 324 mg via ORAL

## 2012-03-20 MED ORDER — EPTIFIBATIDE 75 MG/100ML IV SOLN
2.0000 ug/kg/min | INTRAVENOUS | Status: DC
  Administered 2012-03-20 – 2012-03-21 (×3): 2 ug/kg/min via INTRAVENOUS
  Filled 2012-03-20 (×4): qty 100

## 2012-03-20 MED ORDER — METOPROLOL TARTRATE 25 MG PO TABS
25.0000 mg | ORAL_TABLET | Freq: Two times a day (BID) | ORAL | Status: DC
  Administered 2012-03-21 – 2012-03-26 (×10): 25 mg via ORAL
  Filled 2012-03-20 (×12): qty 1

## 2012-03-20 MED ORDER — HEPARIN SODIUM (PORCINE) 5000 UNIT/ML IJ SOLN
INTRAMUSCULAR | Status: AC
  Filled 2012-03-20: qty 1

## 2012-03-20 MED ORDER — ATORVASTATIN CALCIUM 80 MG PO TABS
80.0000 mg | ORAL_TABLET | Freq: Every day | ORAL | Status: DC
  Administered 2012-03-21 – 2012-03-25 (×5): 80 mg via ORAL
  Filled 2012-03-20 (×6): qty 1

## 2012-03-20 MED ORDER — ASPIRIN 81 MG PO CHEW
81.0000 mg | CHEWABLE_TABLET | Freq: Every day | ORAL | Status: DC
  Administered 2012-03-21 – 2012-03-26 (×6): 81 mg via ORAL
  Filled 2012-03-20 (×5): qty 1

## 2012-03-20 MED ORDER — VERAPAMIL HCL 2.5 MG/ML IV SOLN
INTRAVENOUS | Status: AC
  Filled 2012-03-20: qty 2

## 2012-03-20 MED ORDER — LURASIDONE HCL 40 MG PO TABS
40.0000 mg | ORAL_TABLET | Freq: Every day | ORAL | Status: DC
  Administered 2012-03-21 – 2012-03-24 (×4): 40 mg via ORAL
  Filled 2012-03-20 (×8): qty 1

## 2012-03-20 MED ORDER — FENTANYL CITRATE 0.05 MG/ML IJ SOLN
INTRAMUSCULAR | Status: AC
  Filled 2012-03-20: qty 2

## 2012-03-20 MED ORDER — CLONAZEPAM 0.5 MG PO TABS
0.5000 mg | ORAL_TABLET | Freq: Three times a day (TID) | ORAL | Status: DC | PRN
  Administered 2012-03-21 – 2012-03-25 (×4): 0.5 mg via ORAL
  Filled 2012-03-20 (×4): qty 1

## 2012-03-20 MED ORDER — SODIUM CHLORIDE 0.9 % IV SOLN
INTRAVENOUS | Status: AC
  Administered 2012-03-20: 22:00:00 via INTRAVENOUS

## 2012-03-20 MED ORDER — ONDANSETRON HCL 4 MG/2ML IJ SOLN
INTRAMUSCULAR | Status: AC
  Filled 2012-03-20: qty 2

## 2012-03-20 MED ORDER — HEPARIN (PORCINE) IN NACL 2-0.9 UNIT/ML-% IJ SOLN
INTRAMUSCULAR | Status: AC
  Filled 2012-03-20: qty 2000

## 2012-03-20 MED ORDER — HEPARIN BOLUS VIA INFUSION
4000.0000 [IU] | Freq: Once | INTRAVENOUS | Status: AC
  Administered 2012-03-20: 4000 [IU] via INTRAVENOUS

## 2012-03-20 MED ORDER — MIDAZOLAM HCL 2 MG/2ML IJ SOLN
INTRAMUSCULAR | Status: AC
  Filled 2012-03-20: qty 2

## 2012-03-20 NOTE — ED Provider Notes (Signed)
I saw and evaluated the patient, reviewed the resident's note and I agree with the findings and plan.   Loren Racer, MD 03/20/12 [phone]
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
## 2012-03-20 NOTE — ED Notes (Addendum)
Per EMS, pt started having CP in the center of his chest. It radiated to his left side of chest. Pt also complaining of nausea and weakness. Pain is a pressure. Pt had a MI on fathers day and had 2 stents placed. Pt stated that he has not been taking his prescription medications because of cost. Pt was given 6mg  Morphine, 4mg  Zofran, and 2 nitroglycerin sl. Vitals WNL. 18g L arm started by EMS.

## 2012-03-20 NOTE — ED Notes (Signed)
Pt transported to Cath Lab

## 2012-03-20 NOTE — CV Procedure (Signed)
Cardiac Catheterization Procedure Note  Name: Bruce [name] MRN: [id number] DOB: 1946-01-22  Procedure: Left Heart Cath, Selective Coronary Angiography, PTCA and thrombectomy of proximal RCA for subacute stent thrombosis.  Indication: Acute inferior ST elevation myocardial infarction.  Medications:  Sedation:  1 mg IV Versed, 75 mcg IV Fentanyl  Contrast:  85 mL Omnipaque  Procedural Details: The right wrist was prepped, draped, and anesthetized with 1% lidocaine. Using the modified Seldinger technique, a 6 French sheath was introduced into the right radial artery. 3 mg of verapamil was administered through the sheath. 4,000 units of heparin was already given in the ED. A JL 3.5 catheter was used for left main coronary angiography. A JR 4 guide was used to engage the right coronary artery. This catheter also crossed the aortic valve for pressure recordings. Left ventriculogram was not performed. Catheter exchanges were performed over an exchange length guidewire. There were no immediate procedural complications.  Procedural Findings:  Hemodynamics: AO:  147/76   mmHg LV:  156/13    mmHg LVEDP: 18  mmHg  Coronary angiography: Coronary dominance: Right   Left Main: Mildly calcified with smooth distal 30% tapered narrowing .   Left Anterior Descending (LAD):  Mildly calcified vessel with 30-40% diffuse proximal disease extending into the midsegment.  1st diagonal (D1):  Small in size with 60% ostial stenosis.  2nd diagonal (D2):  Normal in size with 50% ostial disease.  3rd diagonal (D3):  Very small in size.  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  Ramus Intermedius:  Normal in size with 30% proximal disease. It gives to normal size branchs which are free of significant disease.  Right Coronary Artery: Normal in size and dominant. 2 overlapped stents are noted in the proximal segment. There is total occlusion starting at the  proximal segment of the stent with large thrombus burden and TIMI 0 flow.  posterior descending artery: Medium in size with minor irregularities.  posterior lateral branch:  Normal in size with minor irregularities.  RV branch: Is very large in size with 50% ostial stenosis.  Left ventriculography: Not performed. Recent ejection fraction was 50%.  PCI Note:  Following the diagnostic procedure, the decision was made to proceed with PCI. Weight-based bivalirudin was given for anticoagulation. Once a therapeutic ACT was achieved, a 6 [place] JR 4 guide catheter was inserted.  A intuition coronary guidewire was used to cross the lesion.  Thrombectomy was performed with a Pronto catheter. 2 passes were performed. Large amount of thrombus was retrieved with establishment of TIMI 3 flow. The catheter did not pass to the distal stented segment which I suspected was due to the wire being under a stent struts. Thus, I rewired the vessel again before performing balloon angioplasty. The lesion was dilated with a 2.5 noncompliant balloon.  Angiography showed good results but still with residual thrombus in the mid and proximal segments. Thus, I used a 3.0 noncompliant balloon to dilate the mid and proximal segments with prolonged inflations up to 1 minute..  Following PCI, there was 10% residual stenosis and TIMI-3 flow. There was mild residual thrombus. Thus, I decided to start Integrilin for 18 hours. Final angiography confirmed good result. The patient tolerated the procedure well. There were no immediate procedural complications. A TR band was used for radial hemostasis. The patient was transferred to the post catheterization recovery area for further monitoring.  PCI Data: Vessel - proximal RCA/Segment - 1 Percent Stenosis (pre)  100% TIMI-flow 0 Stent : No stent. Only thrombectomy and balloon angioplasty. Percent Stenosis (post) 10% TIMI-flow (post) 3   Final Conclusions:  1. Acute inferior ST  elevation myocardial infarction due to subacute stent thrombosis. The patient stopped taking his medications 3 days before presentation. 2. Mild to moderate disease in the LAD and left circumflex distribution. 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 3. Mildly elevated left ventricular end-diastolic pressure. 4. Successful thrombectomy and balloon angioplasty to the proximal RCA with establishment of TIMI 3 flow.  Recommendations:  This is going to be a difficult case to manage given that the patient stopped taking his medications knowing the risks. I will continue Integrilin for 18 hours due to residual thrombus. Brillinta and other cardiac medications will be resumed. The patient will need psychiatric consult while inpatient.  Lorine Bears MD, [facility] 03/20/2012, 9:39 PM

## 2012-03-20 NOTE — ED Notes (Addendum)
Dr. Ranae Palms at bedside. Pt placed on Defib pads and zoll monitor.
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
## 2012-03-21 ENCOUNTER — Telehealth: Payer: Self-pay | Admitting: Cardiology

## 2012-03-21 ENCOUNTER — Encounter (HOSPITAL_COMMUNITY): Payer: Self-pay | Admitting: Physician Assistant

## 2012-03-21 DIAGNOSIS — F4310 Post-traumatic stress disorder, unspecified: Secondary | ICD-10-CM

## 2012-03-21 DIAGNOSIS — F200 Paranoid schizophrenia: Secondary | ICD-10-CM

## 2012-03-21 DIAGNOSIS — I2119 ST elevation (STEMI) myocardial infarction involving other coronary artery of inferior wall: Secondary | ICD-10-CM

## 2012-03-21 DIAGNOSIS — E876 Hypokalemia: Secondary | ICD-10-CM

## 2012-03-21 DIAGNOSIS — I2510 Atherosclerotic heart disease of native coronary artery without angina pectoris: Secondary | ICD-10-CM

## 2012-03-21 DIAGNOSIS — D696 Thrombocytopenia, unspecified: Secondary | ICD-10-CM

## 2012-03-21 DIAGNOSIS — E119 Type 2 diabetes mellitus without complications: Secondary | ICD-10-CM

## 2012-03-21 DIAGNOSIS — Z9114 Patient's other noncompliance with medication regimen: Secondary | ICD-10-CM

## 2012-03-21 LAB — CBC
Hemoglobin: 13.9 g/dL (ref 13.0–17.0)
MCH: 32 pg (ref 26.0–34.0)
MCHC: 35.3 g/dL (ref 30.0–36.0)
Platelets: 65 10*3/uL — ABNORMAL LOW (ref 150–400)
Platelets: 92 10*3/uL — ABNORMAL LOW (ref 150–400)
RBC: 4.37 MIL/uL (ref 4.22–5.81)

## 2012-03-21 LAB — BASIC METABOLIC PANEL
Calcium: 8.8 mg/dL (ref 8.4–10.5)
Creatinine, Ser: 0.77 mg/dL (ref 0.50–1.35)
GFR calc non Af Amer: >90 mL/min (ref >90)
Sodium: 141 mEq/L (ref 135–145)

## 2012-03-21 LAB — POCT ACTIVATED CLOTTING TIME: Activated Clotting Time: 569 seconds

## 2012-03-21 LAB — CARDIAC PANEL(CRET KIN+CKTOT+MB+TROPI)
CK, MB: 19.9 ng/mL (ref 0.3–4.0)
CK, MB: 16.6 ng/mL (ref 0.3–4.0)

## 2012-03-21 LAB — GLUCOSE, CAPILLARY
Glucose-Capillary: 155 mg/dL — ABNORMAL HIGH (ref 70–99)
Glucose-Capillary: 156 mg/dL — ABNORMAL HIGH (ref 70–99)

## 2012-03-21 MED ORDER — POTASSIUM CHLORIDE CRYS ER 20 MEQ PO TBCR
40.0000 meq | EXTENDED_RELEASE_TABLET | Freq: Once | ORAL | Status: AC
  Administered 2012-03-21: 40 meq via ORAL
  Filled 2012-03-21: qty 2

## 2012-03-21 MED ORDER — LORAZEPAM 2 MG/ML IJ SOLN
1.0000 mg | INTRAMUSCULAR | Status: DC | PRN
  Administered 2012-03-23: 1 mg via INTRAVENOUS
  Filled 2012-03-21: qty 1

## 2012-03-21 MED ORDER — INSULIN ASPART 100 UNIT/ML ~~LOC~~ SOLN
0.0000 [IU] | Freq: Three times a day (TID) | SUBCUTANEOUS | Status: DC
  Administered 2012-03-21 – 2012-03-23 (×8): 3 [IU] via SUBCUTANEOUS
  Administered 2012-03-24 (×2): 5 [IU] via SUBCUTANEOUS
  Administered 2012-03-24 – 2012-03-25 (×2): 3 [IU] via SUBCUTANEOUS
  Administered 2012-03-25: 5 [IU] via SUBCUTANEOUS
  Administered 2012-03-26: 2 [IU] via SUBCUTANEOUS

## 2012-03-21 MED FILL — Dextrose Inj 5%: INTRAVENOUS | Qty: 50 | Status: AC

## 2012-03-21 NOTE — Progress Notes (Signed)
Patient Name: Bruce [name] Date of Encounter: 03/21/2012     Principal Problem:  *ST elevation myocardial infarction (STEMI) of inferior wall, subsequent episode of care Active Problems:  Schizophrenia  Hypertension  Noncompliance with medications  Type 2 diabetes mellitus  Hypokalemia  Thrombocytopenia  CAD (coronary artery disease)    SUBJECTIVE: Fairly anxious this morning. Feels "much better." No chest pain, shortness of breath, diaphoresis, n/v or palpitations.    OBJECTIVE  Filed Vitals:   03/21/12 0500 03/21/12 0600 03/21/12 0700 03/21/12 0800  BP: 90/57 111/70 111/65 120/80  Pulse: 50 49 54 59  Temp:   97.6 F (36.4 C)   TempSrc:      Resp: 14 17 18 14   Height:      Weight:      SpO2: 100% 100% 100% 100%    Intake/Output Summary (Last 24 hours) at 03/21/12 0936 Last data filed at 03/21/12 0600  Gross per 24 hour  Intake 1255.2 ml  Output    425 ml  Net  830.2 ml   Weight change:   PHYSICAL EXAM  General: Well developed, well nourished, in no acute distress. Head: Normocephalic, atraumatic, sclera non-icteric, no xanthomas, nares are without discharge.  Neck: Supple without bruits or JVD. Lungs:  Pain on deep inspiration. Resp regular and unlabored, CTAB without wheezes, rales or rhonchi Heart: Bradycardic, regular, no s3, s4, or murmurs. Abdomen: Soft, non-tender, non-distended, BS + x 4.  Msk:  Strength and tone appears normal for age. Extremities: No clubbing, cyanosis or edema. DP/PT/Radials 2+ and equal bilaterally. Neuro: Alert and oriented X 3. Moves all extremities spontaneously. Psych: Normal affect.  LABS:  Recent Labs  [facility] 03/21/12 0508 03/20/12 2247   WBC 5.6 7.8   HGB 13.1 13.9   HCT 37.1* 39.0   MCV 90.5 90.5   PLT 65* 63*    Lab 03/21/12 0508 03/20/12 2247 03/20/12 2033  NA 141 142 140140  K 3.2* 3.7 3.84.2  CL 104 105 102103  CO2 26 27 23   BUN 6 6 5*3*  CREATININE 0.77 0.74 0.760.90  CALCIUM 8.8 9.0 9.4    PROT -- 6.1 --  BILITOT -- 0.5 --  ALKPHOS -- 68 --  ALT -- 26 --  AST -- 42* --  AMYLASE -- -- --  LIPASE -- -- --  GLUCOSE 213* 186* 228*221*   Recent Labs  Basename 03/21/12 0508 03/20/12 2237   CKTOTAL 216 185   CKMB 19.9* 13.0*   CKMBINDEX -- --   TROPONINI 7.68* 4.49*   TELE: sinus bradycardia, 50 bpm  ECG: sinus bradycardia, 54 bpm, TWIs II, III, aVF, LAD, LAE   Radiology/Studies:  Dg Chest Port 1 View  03/02/2012  *RADIOLOGY REPORT*  Clinical Data: Shortness of breath, chest pain, myocardial infarction  PORTABLE CHEST - 1 VIEW  Comparison: 10/23/2008  Findings: Unchanged cardiac silhouette and mediastinal contours. Lung volumes are reduced with minimal bibasilar opacities, left greater than right, likely atelectasis.  No definite pleural effusion or pneumothorax.  No definite evidence of pulmonary edema. Unchanged bones.  IMPRESSION: Decreased lung volumes without acute cardiopulmonary disease.  Original Report Authenticated By: Waynard Reeds, M.D.    Current Medications:     . aspirin  324 mg Oral Once  . aspirin  81 mg Oral Daily  . atorvastatin  80 mg Oral QHS  . bivalirudin      . fentaNYL      . heparin      . heparin      .  heparin  4,000 Units Intravenous Once  . insulin aspart  0-15 Units Subcutaneous TID WC  . lidocaine      . lurasidone  40 mg Oral Q breakfast  . metoprolol tartrate  25 mg Oral BID  . midazolam      . nitroGLYCERIN      . ondansetron      . potassium chloride  40 mEq Oral Once  . potassium chloride  40 mEq Oral Once  . Ticagrelor      . Ticagrelor  90 mg Oral BID  . verapamil        ASSESSMENT AND PLAN:  1. Inferior STEMI 2. CAD 3. Schizophrenia 4. Thrombocytopenia 5. Hypokalemia 6. Type 2 DM 7. HTN 8. 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 The patient stopped taking his medications 3-4 days prior to admission. He has underlying schizophrenia which is largely contributing to his medication noncompliance and clinical picture. He described a long story to me post-discharge from his prior admission in which he expressed financial difficulties in obtaining his medications, misunderstanding of medical insurance and conflict and distrust of healthcare professionals. Going forward, his schizophrenia will need to be managed efficiently and consistently to ensure compliance with medications and follow-up. Behavioral medicine had been consulted and started him on Latuda during his prior admission (continued this admission, lorazepam IV PRN added for acute psychosis). He did have an acute psychotic episode during this time. He is fairly anxious this morning, but not delusional. I have consulted behavioral medicine, and they will see him. Additionally, he will need care management services for medication assistance.   From a cardiac standpoint, he feels well this morning. He does endorse sharp pain on inspiration. No ST changes on EKG this AM. Evaluate further with CXR. Low suspicion for PNA (no cough, afebrile, no leukocytosis). Will review next set of cardiac biomarkers. Integrillin had been started last night at 9PM to run 18 hours for residual RCA thrombus. CBC post-cath and this morning revealed thrombocytopenia (PLT ~ 60K). After discussing with MD, this was held. Will review CBC for improvement. Continue ASA/Brilinta. Continue BB- will put hold parameters on Lopressor (sinus bradycardia HR 50s overnight). Continue statin and NTG SL PRN.   Hypokalemia will be supplemented. SSI added for glycemic control. BP well-controlled this AM. Further recommendations per MD.   Signed, R. Hurman Horn, PA-C 03/21/2012, 9:36 AM   I have personally seen and examined this patient with Hurman Horn, PA-C. I agree  with the assessment and plan as outlined above. He is still having some chest pains but EKG unchanged. Integrilin stopped. Will recheck platelets in am. Continue other meds. Psych consult pending. CCU today with episodes of chest pain.   Bruce [name] 12:29 PM 03/21/2012

## 2012-03-21 NOTE — Progress Notes (Signed)
Bruce Horn pa notified of chest pain 3/10 and ekg results.   Okay to give sl ntg.  When went in to give ntg pt denies any chest pain.

## 2012-03-21 NOTE — Care Management Note (Unsigned)
    Page 1 of 2   03/26/2012     11:44:11 AM   CARE MANAGEMENT NOTE 03/26/2012  Patient:  Bruce [name], Bruce [name]   Account Number:  [id number]  Date Initiated:  03/21/2012  Documentation initiated by:  Junius Creamer  Subjective/Objective Assessment:   adm w mi     Action/Plan:   lives alone   Anticipated DC Date:  03/25/2012   Anticipated DC Plan:  HOME W HOME HEALTH SERVICES  In-house referral  Clinical Social Worker      DC Planning Services  CM consult      [facility] Choice  Resumption Of Svcs/PTA Provider   Choice offered to / List presented to:  C-1 Patient        HH arranged  HH-1 RN  HH-6 SOCIAL WORKER      HH agency  [facility]   Status of service:  In process, will continue to follow Medicare Important Message given?   (If response is "NO", the following Medicare IM given date fields will be blank) Date Medicare IM given:   Date Additional Medicare IM given:    Discharge Disposition:  HOME W HOME HEALTH SERVICES  Per UR Regulation:  Reviewed for med. necessity/level of care/duration of stay  If discussed at Long Length of Stay Meetings, dates discussed:    Comments:  03/26/12  1142  Mar Walmer SIMMONS RN, BSN [phone] REFERRAL PLACED TO [facility] FOR [facility] PSYCH RN FOR HOME VISIT; WILL NEED ORDER PLACED IN EPIC FOR HHRN THEN WILL FAX INFORMATION TO [facility].   03/25/12  1104  Daryana Whirley SIMMONS RN, BSN [phone] SEEMS MEDICALLY STABLE FOR DISCHARGE; PT'S GIRLFRIEND IN ROOM; WILL NEED TO CONTINUE WITH OP PSYCH SERVICES.   7/5 9:23a debbie dowell rn,bsn [phone] will get cm sec to ck on cpay for brilinta. alerted marie w [facility] of adm.

## 2012-03-21 NOTE — Progress Notes (Signed)
Chaplain followed up with patient per referral from the overnight on-call chaplain. Chaplain experienced the patient as agitated and paranoid. Patient stated that he felt frustrated with the medical systems that he has sought care from [facility], [facility], etc.). Patient also stated that he feels isolated and abandoned by his family. Chaplain listened and offered emotional support. Follow upas needed.

## 2012-03-21 NOTE — Progress Notes (Signed)
Inpatient Diabetes Program Recommendations  AACE/ADA: New Consensus Statement on Inpatient Glycemic Control (2009)  Target Ranges:  Prepandial:   less than 140 mg/dL      Peak postprandial:   less than 180 mg/dL (1-2 hours)      Critically ill patients:  140 - 180 mg/dL    Results for GARNER, DULLEA (MRN [id number]) as of 03/21/2012 08:24  Ref. Range 03/21/2012 05:08  Glucose Latest Range: 70-99 mg/dL 409 (H)   Patient's home Diabetes regimen includes: 1. 70/30 insulin- 15 units in the AM with breakfast & 20 units in the PM with supper 2. Regular insulin 3-20 units tid with meals SSI  Inpatient Diabetes Program Recommendations Insulin - Basal: Please add at least 1/2 home dose 70/30 insulin- 8 units in the AM with breakfast and 10 units in the PM with supper  Note: Titrate 70/30 insulin as needed.  Last A1c showed decent control (7.8% 03/02/12) Will follow. Ambrose Finland RN, MSN, CDE Diabetes Coordinator Inpatient Diabetes Program [phone]

## 2012-03-21 NOTE — Code Documentation (Deleted)
CODE BLUE NOTE  Patient Name: Bruce [name]   MRN: [id number]   Date of Birth/ Sex: [birth year] , male      Admission Date: 03/20/2012  Attending Provider: Iran Ouch, MD  Primary Diagnosis: ST elevation myocardial infarction (STEMI) of inferior wall, subsequent episode of care    Indication: Pt was in his usual state of health until this PM, when he was noted to be in respiratory distress. Code blue was subsequently called. At the time of arrival on scene, pt was being bagged and did have pulse and rhythm. He did have respiratory distress which led to a fib and v tach. CPR initiated and normal rhythm resumed and bagged until intubation.    Technical Description:  - CPR performance duration:  1 minute  - Was defibrillation or cardioversion used? No   - Was external pacer placed? No  - Was patient intubated pre/post CPR? Yes    Medications Administered: Y = Yes; Blank = No Amiodarone    Atropine    Calcium    Epinephrine    Lidocaine    Magnesium    Norepinephrine    Phenylephrine    Sodium bicarbonate    Vasopressin      Post CPR evaluation:  - Final Status - Was patient successfully resuscitated ? Yes - What is current rhythm? sinus - What is current hemodynamic status? Stable, intubated   Miscellaneous Information:  - Labs sent, including: i-stat  - Primary team notified?  Paged, awaiting return call  - Family Notified? No  - Additional notes/ transfer status: To ICU   [name], MD  03/21/2012, 8:24 PM
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
## 2012-03-21 NOTE — Telephone Encounter (Signed)
Pt has been discharged since yesterday he refused future visits and pt is non compliant will not take meds or Blood sugars and kicked nurse out of home
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
## 2012-03-21 NOTE — Progress Notes (Signed)
Pt states while trying to use the bathroom he developed some sharp chest pain.  Now just complains of chest pressure. Rates a 3/10.  Calling for stat ekg.

## 2012-03-21 NOTE — Consult Note (Signed)
Please see recent note from current writer regarding patient and Psych Consult.  Update: For this admission.  Have been trying to reach patient while in the community for follow up from recent discharge from hospital 03/07/2012.  During last admission, patient was arranged with CST and medication management with [facility] and assessment/appointment made for patient the following day (Saturday) 6/22.    Patient was compliant with appointment attended appointment, but then became very agitated and upset thus abruptly left appointment. Spoke with NP and Psych MD from [facility] and noted that patient would not return and they could not finish the assessment.   Upon last admission, patient was going back home to his apartment. Patient's benefits were reviewed per CM Raynelle Fanning, but due to patient's insurance status, medication assistance could not be utilized.  Patient was given a prescription card and also aware of $4.00 drugs.  Patient was also set up with a taxi and given 2 bus passes to utilize transportation due to heart problems and not being able to drive.  Patient was also given two different community supports and list of providers with regards to mental health treatment.  He was also educated about crisis mobile and given support card.  Patient was also referred to [facility] for community medical support, however did not meet criteria and was declined.   Patient reports limited support with family and estrangement, however patient has been in contact with son and daughter and providers per previous admission has spoken with daughter, unclear of patient voicing estrangement.    Unit CSW is aware of patient needs and updated regarding support and consult.  Patient has outpatient resources and has capacity to set up own appointment.  If needs arise, Psych CSW will facilitate.  Ashley Jacobs, MSW LCSW [phone]

## 2012-03-22 LAB — BASIC METABOLIC PANEL
Chloride: 103 mEq/L (ref 96–112)
Creatinine, Ser: 0.75 mg/dL (ref 0.50–1.35)
GFR calc Af Amer: >90 mL/min (ref >90)
GFR calc non Af Amer: >90 mL/min (ref >90)
Potassium: 3.9 mEq/L (ref 3.5–5.1)

## 2012-03-22 LAB — CBC
HCT: 43.5 % (ref 39.0–52.0)
Hemoglobin: 15.3 g/dL (ref 13.0–17.0)
RDW: 13.1 % (ref 11.5–15.5)
WBC: 6.6 10*3/uL (ref 4.0–10.5)

## 2012-03-22 LAB — GLUCOSE, CAPILLARY: Glucose-Capillary: 179 mg/dL — ABNORMAL HIGH (ref 70–99)

## 2012-03-22 MED ORDER — TAMSULOSIN HCL 0.4 MG PO CAPS
0.4000 mg | ORAL_CAPSULE | Freq: Every day | ORAL | Status: DC
  Administered 2012-03-22 – 2012-03-26 (×5): 0.4 mg via ORAL
  Filled 2012-03-22 (×5): qty 1

## 2012-03-22 NOTE — Progress Notes (Signed)
CARDIAC REHAB PHASE I   PRE:  Rate/Rhythm: 74 SR  BP:  Supine: 121/76  Sitting:   Standing:    SaO2: 97 RA  MODE:  Ambulation: 500 ft   POST:  Rate/Rhythem: 100 SR  BP:  Supine:   Sitting: 127/71  Standing:    SaO2: 100 RA 1015-1110  Assisted X 1 and used walker to ambulate. Gait steady with walker. VS stable Pt tolerates walk well  without c/o of cp or SOB. VS stable. Pt is in good spirits this am. He is very positive. Strongly encouraged medication compliance.   Bruce [name]

## 2012-03-22 NOTE — Progress Notes (Signed)
Patient c/o "difficulty urinating" states he usually takes "hytrin" for BPH. Bladder scan reveals 530 ml. Ward Givens notified. Orders received. Harout Scheurich, Chrystine Oiler

## 2012-03-22 NOTE — Progress Notes (Signed)
Patient ID: CARTRELL BENTSEN, male   DOB: [birth year], 66 y.o.   MRN: [id number]   Patient Name: Bruce [name] Date of Encounter: 03/22/2012    SUBJECTIVE  Feeling much more positive this morning. He states that he wants to live and he will take his meds are instructions. He will need assistance with medications. Still has intermittent chest heaviness with mild quick shooting pains. No evidence of ischemia.  CURRENT MEDS    . aspirin  81 mg Oral Daily  . atorvastatin  80 mg Oral QHS  . heparin      . insulin aspart  0-15 Units Subcutaneous TID WC  . lurasidone  40 mg Oral Q breakfast  . metoprolol tartrate  25 mg Oral BID  . ondansetron      . potassium chloride  40 mEq Oral Once  . Ticagrelor  90 mg Oral BID    OBJECTIVE  Filed Vitals:   03/21/12 2000 03/21/12 2312 03/22/12 0000 03/22/12 0452  BP: 132/81 118/72  117/74  Pulse: 61 55  60  Temp: 97.8 F (36.6 C) 98.5 F (36.9 C)  98.5 F (36.9 C)  TempSrc: Oral Oral  Oral  Resp: 19 17 17 21   Height:      Weight:      SpO2: 98% 99%  98%    Intake/Output Summary (Last 24 hours) at 03/22/12 0812 Last data filed at 03/22/12 0600  Gross per 24 hour  Intake    840 ml  Output   1625 ml  Net   -785 ml   Filed Weights   03/20/12 2018 03/20/12 2200  Weight: 176 lb 5.9 oz (80 kg) 172 lb 6.4 oz (78.2 kg)    PHYSICAL EXAM  General: Pleasant, NAD. Neuro: Alert and oriented X 3. Moves all extremities spontaneously. Psych: Normal affect. HEENT:  Normal  Neck: Supple without bruits or JVD. Lungs:  Resp regular and unlabored, CTA. Heart: RRR no s3, s4, or murmurs, no row Abdomen: Soft, non-tender, non-distended, BS + x 4.  Extremities: No clubbing, cyanosis or edema. DP/PT/Radials 2+ and equal bilaterally.  Accessory Clinical Findings  CBC  Basename 03/22/12 0608 03/21/12 1453 03/20/12 2247  WBC 6.6 7.0 --  NEUTROABS -- -- 5.7  HGB 15.3 13.9 --  HCT 43.5 40.0 --  MCV 92.0 91.5 --  PLT 100* 92* --   Basic  Metabolic Panel  Basename 03/22/12 0608 03/21/12 0508  NA 138 141  K 3.9 3.2*  CL 103 104  CO2 25 26  GLUCOSE 153* 213*  BUN 7 6  CREATININE 0.75 0.77  CALCIUM 9.7 8.8  MG -- --  PHOS -- --   Liver Function Tests  Basename 03/20/12 2247 03/20/12 2033  AST 42* 23  ALT 26 23  ALKPHOS 68 76  BILITOT 0.5 0.4  PROT 6.1 6.8  ALBUMIN 3.8 4.2   No results found for this basename: LIPASE:2,AMYLASE:2 in the last 72 hours Cardiac Enzymes  Basename 03/21/12 1030 03/21/12 0508 03/20/12 2237  CKTOTAL 183 216 185  CKMB 16.6* 19.9* 13.0*  CKMBINDEX -- -- --  TROPONINI 3.40* 7.68* 4.49*   BNP No components found with this basename: POCBNP:3 D-Dimer No results found for this basename: DDIMER:2 in the last 72 hours Hemoglobin A1C No results found for this basename: HGBA1C in the last 72 hours Fasting Lipid Panel No results found for this basename: CHOL,HDL,LDLCALC,TRIG,CHOLHDL,LDLDIRECT in the last 72 hours Thyroid Function Tests No results found for this basename: TSH,T4TOTAL,FREET3,T3FREE,THYROIDAB in the last  72 hours  TELE  Normal sinus rhythm 70s  ECG   Radiology/Studies  Dg Chest Port 1 View  03/02/2012  *RADIOLOGY REPORT*  Clinical Data: Shortness of breath, chest pain, myocardial infarction  PORTABLE CHEST - 1 VIEW  Comparison: 10/23/2008  Findings: Unchanged cardiac silhouette and mediastinal contours. Lung volumes are reduced with minimal bibasilar opacities, left greater than right, likely atelectasis.  No definite pleural effusion or pneumothorax.  No definite evidence of pulmonary edema. Unchanged bones.  IMPRESSION: Decreased lung volumes without acute cardiopulmonary disease.  Original Report Authenticated By: Waynard Reeds, M.D.    ASSESSMENT AND PLAN  Principal Problem:  *ST elevation myocardial infarction (STEMI) of inferior Avilyn Virtue, subsequent episode of care Active Problems:  Schizophrenia  Hypertension  Noncompliance with medications  Type 2 diabetes  mellitus  Hypokalemia  Thrombocytopenia  CAD (coronary artery disease)    See extensive note yesterday. He is much more positive today and is willing to take care of himself and follow our advice. We'll transfer to telemetry with cardiac rehabilitation. We'll need care management to help with his medications. He has [employer] but has to fill out some forms. They do not have been listed as retired.  Platelet count has increased a little. Other labs are stable. No change in medications today.  Signed, Valera Castle MD

## 2012-03-23 LAB — GLUCOSE, CAPILLARY
Glucose-Capillary: 162 mg/dL — ABNORMAL HIGH (ref 70–99)
Glucose-Capillary: 180 mg/dL — ABNORMAL HIGH (ref 70–99)
Glucose-Capillary: 170 mg/dL — ABNORMAL HIGH (ref 70–99)

## 2012-03-23 NOTE — Progress Notes (Signed)
Patient uses Refresh eye drops for left eye. Needs order. Justice Milliron, Chrystine Oiler

## 2012-03-23 NOTE — Progress Notes (Signed)
   SUBJECTIVE:  No chest pain.  No SOB   PHYSICAL EXAM Filed Vitals:   03/22/12 1448 03/22/12 1850 03/22/12 1946 03/23/12 0422  BP: 150/73 126/75 129/89 106/71  Pulse: 61 68 69 72  Temp: 98.4 F (36.9 C)  97.3 F (36.3 C) 98.4 F (36.9 C)  TempSrc: Oral  Oral Oral  Resp: 18  18 20   Height:      Weight:      SpO2: 100%  99% 98%   General:  No distress Lungs:  Clear Heart:  RRR Abdomen:  Positive bowel sounds, no rebound no guarding Extremities:  No edema  LABS: Lab Results  Component Value Date   CKTOTAL 183 03/21/2012   CKMB 16.6* 03/21/2012   TROPONINI 3.40* 03/21/2012   Results for orders placed during the hospital encounter of 03/20/12 (from the past 24 hour(s))  GLUCOSE, CAPILLARY     Status: Abnormal   Collection Time   03/22/12 12:06 PM      Component Value Range   Glucose-Capillary 179 (*) 70 - 99 mg/dL  GLUCOSE, CAPILLARY     Status: Abnormal   Collection Time   03/22/12  4:20 PM      Component Value Range   Glucose-Capillary 110 (*) 70 - 99 mg/dL  GLUCOSE, CAPILLARY     Status: Abnormal   Collection Time   03/22/12  8:49 PM      Component Value Range   Glucose-Capillary 145 (*) 70 - 99 mg/dL   Comment 1 Notify RN    GLUCOSE, CAPILLARY     Status: Abnormal   Collection Time   03/23/12  5:56 AM      Component Value Range   Glucose-Capillary 162 (*) 70 - 99 mg/dL    Intake/Output Summary (Last 24 hours) at 03/23/12 0831 Last data filed at 03/23/12 0700  Gross per 24 hour  Intake    240 ml  Output    300 ml  Net    -60 ml     ASSESSMENT AND PLAN:  ST elevation myocardial infarction (STEMI) of inferior wall, subsequent episode of care:  Continue current medications.   Schizophrenia:  On Latuda.  Psychiatry following  Hypertension: Continue current medications  Urinary retention:  Started on Flomax  Type 2 diabetes mellitus:  Diabetes management has suggested 1/2 home dose 70/30 insulin- 8 units in the AM with breakfast and 10 units in the PM with  supper.  However, his blood sugars are running low.  I think we need to understand how he is going to get his medications and who is following him.    Disposition:  I think that there needs to be coordination among the provider to arrange follow up and a plan for discharge.  He needs a local primary care physician.   Bruce [name] 03/23/2012 8:31 AM

## 2012-03-23 NOTE — Progress Notes (Signed)
Patient states that he if feeling "angry". PRN meds given, will cont. To monitor.

## 2012-03-24 DIAGNOSIS — F200 Paranoid schizophrenia: Secondary | ICD-10-CM | POA: Diagnosis present

## 2012-03-24 LAB — GLUCOSE, CAPILLARY
Glucose-Capillary: 152 mg/dL — ABNORMAL HIGH (ref 70–99)
Glucose-Capillary: 151 mg/dL — ABNORMAL HIGH (ref 70–99)

## 2012-03-24 MED ORDER — INSULIN ASPART PROT & ASPART (70-30 MIX) 100 UNIT/ML ~~LOC~~ SUSP
10.0000 [IU] | Freq: Every day | SUBCUTANEOUS | Status: DC
  Administered 2012-03-24: 10 [IU] via SUBCUTANEOUS
  Filled 2012-03-24: qty 3

## 2012-03-24 MED ORDER — INSULIN ASPART PROT & ASPART (70-30 MIX) 100 UNIT/ML ~~LOC~~ SUSP
8.0000 [IU] | Freq: Every day | SUBCUTANEOUS | Status: DC
  Administered 2012-03-25: 8 [IU] via SUBCUTANEOUS
  Filled 2012-03-24: qty 3

## 2012-03-24 NOTE — Progress Notes (Signed)
Cardiology Progress Note Patient Name: Bruce [name] Date of Encounter: 03/24/2012, 9:34 AM     Subjective  No overnight events. Patient denies chest pain or sob. Complains of feeling hot and cold as well as intermittently agitated.   Objective   Telemetry: Sinus rhythm 50-70s  Medications: . aspirin  81 mg Oral Daily  . atorvastatin  80 mg Oral QHS  . insulin aspart  0-15 Units Subcutaneous TID WC  . lurasidone  40 mg Oral Q breakfast  . metoprolol tartrate  25 mg Oral BID  . Tamsulosin HCl  0.4 mg Oral Daily  . Ticagrelor  90 mg Oral BID      Physical Exam: Temp:  [97.4 F (36.3 C)-98.2 F (36.8 C)] 98.2 F (36.8 C) (07/08 0900) Pulse Rate:  [65-76] 76  (07/08 0900) Resp:  [18-20] 18  (07/08 0444) BP: (98-122)/(64-81) 122/81 mmHg (07/08 0444) SpO2:  [98 %-100 %] 100 % (07/08 0900)  General: hispanic male, in no acute distress. Head: Normocephalic, atraumatic, sclera non-icteric, nares are without discharge.  Neck: Supple. Negative for carotid bruits or JVD Lungs: Clear bilaterally to auscultation without wheezes, rales, or rhonchi. Breathing is unlabored. Heart: RRR S1 S2 without murmurs, rubs, or gallops.  Abdomen: Soft, non-tender, non-distended with normoactive bowel sounds. No rebound/guarding. No obvious abdominal masses. Msk:  Strength and tone appear normal for age. Extremities: Right wrist without hematoma. No edema. No clubbing or cyanosis. Distal pedal pulses are intact and equal bilaterally. Neuro: Alert and oriented X 3. Moves all extremities spontaneously. Psych:  Responds to questions appropriately with a normal affect.   Intake/Output Summary (Last 24 hours) at 03/24/12 0934 Last data filed at 03/23/12 1100  Gross per 24 hour  Intake    240 ml  Output      0 ml  Net    240 ml    Labs:  [facility] 03/22/12 0608  NA 138  K 3.9  CL 103  CO2 25  GLUCOSE 153*  BUN 7  CREATININE 0.75  CALCIUM 9.7  MG --  PHOS --   Basename 03/22/12  0608 03/21/12 1453  WBC 6.6 7.0  NEUTROABS -- --  HGB 15.3 13.9  HCT 43.5 40.0  MCV 92.0 91.5  PLT 100* 92*    Basename 03/21/12 1030  CKTOTAL 183  CKMB 16.6*  TROPONINI 3.40*   Radiology/Studies:   03/20/12 - Cardiac Cath Hemodynamics:  AO: 147/76 mmHg  LV: 156/13 mmHg  LVEDP: 18 mmHg  Coronary angiography:  Coronary dominance: Right  Left Main: Mildly calcified with smooth distal 30% tapered narrowing .  Left Anterior Descending (LAD): Mildly calcified vessel with 30-40% diffuse proximal disease extending into the midsegment.  1st diagonal (D1): Small in size with 60% ostial stenosis.  2nd diagonal (D2): Normal in size with 50% ostial disease.  3rd diagonal (D3): Very small in size.  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  Ramus Intermedius: Normal in size with 30% proximal disease. It gives to normal size branchs which are free of significant disease.  Right Coronary Artery: Normal in size and dominant. 2 overlapped stents are noted in the proximal segment. There is total occlusion starting at the proximal segment of the stent with large thrombus burden and TIMI 0 flow.  posterior descending artery: Medium in size with minor irregularities.  posterior lateral branch: Normal in size with minor irregularities.  RV branch: Is very large in size with 50% ostial  stenosis. Left ventriculography: Not performed. Recent ejection fraction was 50%.  Final Conclusions:  1. Acute inferior ST elevation myocardial infarction due to subacute stent thrombosis. The patient stopped taking his medications 3 days before presentation.  2. Mild to moderate disease in the LAD and left circumflex distribution. 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  3. Mildly elevated left ventricular end-diastolic pressure.  4. Successful thrombectomy and balloon angioplasty to the proximal RCA with establishment of TIMI 3 flow.  Recommendations:    This is going to be a difficult case to manage given that the patient stopped taking his medications knowing the risks. I will continue Integrilin for 18 hours due to residual thrombus. Brillinta and other cardiac medications will be resumed. The patient will need psychiatric consult while inpatient.    Assessment and Plan   1. Inferior STEMI 2. Schizophrenia 3. Medication Noncompliance 4. Urinary Rentention 5. Hypertension 6. Diabetes Mellitus, Type 2  Patient presented with acute inferior STEMI in June, was treated with angioplasty and 2 overlapping DES to the prox RCA, and discharged on DAPT. He returned again on 03/20/12 with acute inferior STEMI in the setting of subacute stent thrombosis 2/2 medication noncompliance. He has a significant psychiatric history affecting his ability to maintain compliance with recommended medical treatment. He was initiated on Latuda during his prior admission, however, he did not continue it at discharge due to cost and misunderstanding of medical insurance. He is being followed by psychiatry this admission, Latuda was resumed. Social work is also on Theatre manager. From a cardiac standpoint he is doing well without complaints of chest pain. Cardiac enzymes trended down. Cont ASA, Brilinta, BB, and statin. Blood sugars elevated 140-200s. Resume 70/30 insulin at half home dose per diabetes management recommendations. Blood pressure controlled on current regimen. Cont Flomax for urinary retention.    Signed, HOPE, JESSICA PA-C  Patient seen and examined.  Patient knowingly stopped his antiplatelet treatment.  He feels as though his mood now though is on the upswing.  He said he had trouble accessing social services after discharge, and that SW calls went unanswered.  No current chest pain.  Risk of recurrent ST is moderately high, and I reviewed this with him in detail.   Aim for dc in another 48 hours.
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
## 2012-03-24 NOTE — Progress Notes (Signed)
Pt ambulated 1600 ft on rm air with the assistance of a wheelchair. Pt tolerated activity well and is now resting in bed. Will continue to monitor.   Marjean Imperato M

## 2012-03-25 DIAGNOSIS — E119 Type 2 diabetes mellitus without complications: Secondary | ICD-10-CM

## 2012-03-25 LAB — BASIC METABOLIC PANEL
GFR calc Af Amer: >90 mL/min (ref >90)
GFR calc non Af Amer: 86 mL/min — ABNORMAL LOW (ref >90)
Glucose, Bld: 182 mg/dL — ABNORMAL HIGH (ref 70–99)
Potassium: 3.3 mEq/L — ABNORMAL LOW (ref 3.5–5.1)
Sodium: 136 mEq/L (ref 135–145)

## 2012-03-25 LAB — CBC
Hemoglobin: 16.2 g/dL (ref 13.0–17.0)
MCHC: 36 g/dL (ref 30.0–36.0)
Platelets: 136 10*3/uL — ABNORMAL LOW (ref 150–400)

## 2012-03-25 LAB — GLUCOSE, CAPILLARY
Glucose-Capillary: 157 mg/dL — ABNORMAL HIGH (ref 70–99)
Glucose-Capillary: 107 mg/dL — ABNORMAL HIGH (ref 70–99)

## 2012-03-25 MED ORDER — LURASIDONE HCL 80 MG PO TABS
80.0000 mg | ORAL_TABLET | Freq: Every day | ORAL | Status: DC
  Administered 2012-03-25 – 2012-03-26 (×2): 80 mg via ORAL
  Filled 2012-03-25 (×4): qty 1

## 2012-03-25 MED ORDER — LURASIDONE HCL 80 MG PO TABS
80.0000 mg | ORAL_TABLET | Freq: Every day | ORAL | Status: DC
  Filled 2012-03-25: qty 1

## 2012-03-25 MED ORDER — INSULIN ASPART PROT & ASPART (70-30 MIX) 100 UNIT/ML ~~LOC~~ SUSP
12.0000 [IU] | Freq: Every day | SUBCUTANEOUS | Status: DC
  Administered 2012-03-25: 12 [IU] via SUBCUTANEOUS
  Filled 2012-03-25: qty 3

## 2012-03-25 MED ORDER — INSULIN ASPART PROT & ASPART (70-30 MIX) 100 UNIT/ML ~~LOC~~ SUSP
10.0000 [IU] | Freq: Every day | SUBCUTANEOUS | Status: DC
  Administered 2012-03-26: 10 [IU] via SUBCUTANEOUS
  Filled 2012-03-25: qty 3

## 2012-03-25 MED ORDER — POTASSIUM CHLORIDE CRYS ER 20 MEQ PO TBCR
40.0000 meq | EXTENDED_RELEASE_TABLET | Freq: Once | ORAL | Status: AC
  Administered 2012-03-25: 40 meq via ORAL
  Filled 2012-03-25: qty 2

## 2012-03-25 NOTE — Progress Notes (Signed)
Subjective:  Stable status.  No current chest pain.  Risk of reocclusion is fairly high, and I have reiterated this to the patient and his girlfriend (Friend girl---in the room and he permitted her to stay for the discussion).  Psych note incorrect.  He willing elected not to take antiplatelet drugs, and I am unsure about what happened with not allowing home health to come in.  Have spoken with Marcelino Duster who will help with coordinating social work, case management, and planning for psych follow up.  Psych has recommended increasing Latuda but this will need follow up if we are to do.    Objective:  Vital Signs in the last 24 hours: Temp:  [97.5 F (36.4 C)-98.3 F (36.8 C)] 98.3 F (36.8 C) (07/09 0431) Pulse Rate:  [68-93] 69  (07/09 0431) Resp:  [16-18] 18  (07/09 0431) BP: (111-116)/(70-83) 111/70 mmHg (07/09 0431) SpO2:  [99 %-100 %] 99 % (07/09 0431)  Intake/Output from previous day: 07/08 0701 - 07/09 0700 In: 600 [P.O.:600] Out: 0    Physical Exam: General: Well developed, well nourished, in no acute distress. Head:  Normocephalic and atraumatic. Lungs: Clear to auscultation and percussion. Heart: Normal S1 and S2.  No murmur, rubs or gallops.  Pulses: Pulses normal in all 4 extremities. Extremities: No clubbing or cyanosis. No edema. Neurologic: Alert and oriented x 3.    Lab Results: No results found for this basename: WBC:2,HGB:2,PLT:2 in the last 72 hours No results found for this basename: NA:2,K:2,CL:2,CO2:2,GLUCOSE:2,BUN:2,CREATININE:2 in the last 72 hours No results found for this basename: TROPONINI:2,CK,MB:2 in the last 72 hours Hepatic Function Panel No results found for this basename: PROT,ALBUMIN,AST,ALT,ALKPHOS,BILITOT,BILIDIR,IBILI in the last 72 hours No results found for this basename: CHOL in the last 72 hours No results found for this basename: PROTIME in the last 72 hours  Imaging: No results found.   Assessment/Plan:  Patient Active Hospital  Problem List: ST elevation myocardial infarction (STEMI) of inferior wall, subsequent episode of care (03/21/2012)   Assessment: improved   Plan: continue ambulation and in patient rehab Schizophrenia (03/05/2012)   Assessment: see psych note   Plan: increase meds as recommended. Hypertension ()   Assessment: stable and controlled   Plan: no change.  NO ACE to keep meds simple Noncompliance with medications (03/21/2012)   Assessment: will be ongoing issue   Plan: encourage Type 2 diabetes mellitus (03/21/2012)   Assessment: glucose modest control.     Plan: adjust meds.        [name], MD, [facility], FSCAI 03/25/2012, 7:56 AM

## 2012-03-25 NOTE — Progress Notes (Signed)
CARDIAC REHAB PHASE I   PRE:  Rate/Rhythm: 67 SR    BP: sitting 110/66    SaO2: 99 RA  MODE:  Ambulation: 1000 ft   POST:  Rate/Rhythm: 95    BP: sitting 114/80     SaO2: 98 RA  Tolerated very well. C/o neuropathy in his legs before walk but no c/o walking and sts they feel better after walk. Very lucid, discussed photography and traveling.  [id number]  Harriet Masson CES, ACSM

## 2012-03-26 ENCOUNTER — Encounter (HOSPITAL_COMMUNITY): Payer: Self-pay | Admitting: Nurse Practitioner

## 2012-03-26 LAB — BASIC METABOLIC PANEL
BUN: 14 mg/dL (ref 6–23)
Calcium: 9.6 mg/dL (ref 8.4–10.5)
Creatinine, Ser: 0.87 mg/dL (ref 0.50–1.35)
GFR calc Af Amer: >90 mL/min (ref >90)
GFR calc non Af Amer: 89 mL/min — ABNORMAL LOW (ref >90)
Glucose, Bld: 109 mg/dL — ABNORMAL HIGH (ref 70–99)

## 2012-03-26 LAB — CBC
HCT: 44.4 % (ref 39.0–52.0)
MCH: 31.9 pg (ref 26.0–34.0)
MCHC: 35.1 g/dL (ref 30.0–36.0)
MCV: 90.8 fL (ref 78.0–100.0)
RDW: 12.9 % (ref 11.5–15.5)

## 2012-03-26 MED ORDER — CLOPIDOGREL BISULFATE 75 MG PO TABS: 75.0000 mg | ORAL_TABLET | Freq: Every day | ORAL | Status: DC

## 2012-03-26 MED ORDER — BENZTROPINE MESYLATE 0.5 MG PO TABS: 0.5000 mg | ORAL_TABLET | Freq: Two times a day (BID) | ORAL | Status: DC

## 2012-03-26 MED ORDER — FLUPHENAZINE HCL 2.5 MG PO TABS: 2.5000 mg | ORAL_TABLET | Freq: Two times a day (BID) | ORAL | Status: DC

## 2012-03-26 MED ORDER — ATORVASTATIN CALCIUM 80 MG PO TABS: 80.0000 mg | ORAL_TABLET | Freq: Every day | ORAL | Status: DC

## 2012-03-26 MED ORDER — TICAGRELOR 90 MG PO TABS: 90.0000 mg | ORAL_TABLET | Freq: Two times a day (BID) | ORAL | Status: DC

## 2012-03-26 MED ORDER — METOPROLOL TARTRATE 25 MG PO TABS: 25.0000 mg | ORAL_TABLET | Freq: Two times a day (BID) | ORAL | Status: DC

## 2012-03-26 MED ORDER — NITROGLYCERIN 0.4 MG SL SUBL: 0.4000 mg | SUBLINGUAL_TABLET | SUBLINGUAL | Status: DC | PRN

## 2012-03-26 MED ORDER — LURASIDONE HCL 80 MG PO TABS: 80.0000 mg | ORAL_TABLET | Freq: Every day | ORAL | Status: DC

## 2012-03-26 NOTE — Discharge Summary (Signed)
Patient ID: Bruce [name],  MRN: [id number], DOB/AGE: 20-Jan-1946 66 y.o.  Admit date: 03/20/2012 Discharge date: 03/26/2012  Primary Care Provider: [facility] Primary Cardiologist: T. Riley Kill, MD  Discharge Diagnoses Principal Problem:  *ST elevation myocardial infarction (STEMI) of inferior wall, subsequent episode of care  **s/p PTCA/Thrombectomy this admission secondary to stent thrombosis in setting of brilinta noncompliance.  Active Problems:  Hypertension  Noncompliance with medications  Type 2 diabetes mellitus  Hypokalemia  Thrombocytopenia  CAD (coronary artery disease)  Schizophrenia, paranoid type  **seen by psychiatry this admission.  Allergies Allergies  Allergen Reactions  . Codeine Other (See Comments)    Lose conciousness   Procedures  Cardiac Catheterization and Percutaneous Coronary Intervention 7/  Coronary angiography: Coronary dominance: Right     Left Main: Mildly calcified with smooth distal 30% tapered narrowing .     Left Anterior Descending (LAD):  Mildly calcified vessel with 30-40% diffuse proximal disease extending into the midsegment.   1st diagonal (D1):  Small in size with 60% ostial stenosis.   2nd diagonal (D2):  Normal in size with 50% ostial disease.   3rd diagonal (D3):  Very small in size.   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   Ramus Intermedius:  Normal in size with 30% proximal disease. It gives to normal size branchs which are free of significant disease.   Right Coronary Artery: Normal in size and dominant. 2 overlapped stents are noted in the proximal segment. There is total occlusion starting at the proximal segment of the stent with large thrombus burden and TIMI 0 flow. This was successfully treated with thrombectomy and PTCA without repeat stent placement.  posterior descending artery: Medium in size with minor irregularities.   posterior lateral branch:  Normal in  size with minor irregularities.   RV branch: Is very large in size with 50% ostial stenosis.  Left ventriculography: Not performed. Recent ejection fraction was 50%. _____________  History of Present Illness  66 y/o male with the above complex problem list.  He was recently admitted with inferior STEMI in June of this year requiring DES x 2 the RCA.  His hospital course was prolonged secondary to psychiatric issues and he was seen by psych with initiation of Latuda and outpt referral for counseling made.  Pt refused inpt psychiatry admission.  Following d/c, pt reports that he couldn't afford his medications (though he was given a card for 30 days of Brilinta @ no charge and he did fill that Rx).  He says that he became frustrated with "jumping through hoops" and decided to stop all of his medicines intentionally, stating "I was going to party until the end."  Unfortunately, on 7/4, he developed recurrent chest pain and presented back to [facility] where he was again noted to have inferior ST elevation.  Hospital Course  Pt was taken to the cath lab for emergent cath.  Cath revealed total occlusion within the previously placed proximal RCA stent with large thrombus burden.  This area was successfully treated with thrombectomy and balloon angioplasty without stent placement.  He tolerated procedure well and post-procedure was monitored in the coronary intensive care unit where he ruled in, peaking his CK @ 216, MB @ 19.9, and Troponin I @ 7.68.  His Brilinta was resumed and given his intentional discontinuation with h/o depression and schizophrenia, psychiatry was re-consulted.  After seeing the pt, they recommended titration of Latuda to 80mg  daily and continued outpt psychiatric  services.  I spoke with Dr. Ferol Luz with psychiatry today and due to the pts difficulty in affording his medications at this point, she recommends discontinuation of Latuda and initiation of Cognetin 0.5mg  BID and Prolixin 2.5mg   BID.  Prescriptions have been provided without refills as he reports that he has psychiatric f/u arranged @ the [facility] in [place] on 7/24.  He has also been seen by psychiatric social work who provided information re: community support, a provider list, and crisis center information.  His insurance details were assessed and he was not felt to qualify for drug assistance programs or [facility] community medical support.  From a medical standpoint, pt is felt to be stable for discharge today.  As he continues to work out his insurance situation with the [place], we will plan to allow him to remain on Brilinta until he completes the tablets that he currently has at home and then he will begin plavix 75mg  daily, which he says he will be able to afford.  Discharge Vitals Blood pressure 94/63, pulse 62, temperature 98.2 F (36.8 C), temperature source Oral, resp. rate 17, height 5\' 4"  (1.626 m), weight 172 lb 6.4 oz (78.2 kg), SpO2 99.00%.  Filed Weights   03/20/12 2018 03/20/12 2200  Weight: 176 lb 5.9 oz (80 kg) 172 lb 6.4 oz (78.2 kg)   Labs  CBC  Basename 03/26/12 0515 03/25/12 0815  WBC 8.0 8.5  NEUTROABS -- --  HGB 15.6 16.2  HCT 44.4 45.0  MCV 90.8 90.4  PLT 132* 136*   Basic Metabolic Panel  Basename 03/26/12 0515 03/25/12 0815  NA 138 136  K 3.9 3.3*  CL 102 98  CO2 24 26  GLUCOSE 109* 182*  BUN 14 12  CREATININE 0.87 0.93  CALCIUM 9.6 9.7  MG -- --  PHOS -- --   Disposition  Pt is being discharged home today in good condition.  Follow-up Plans & Appointments  Follow-up Information    Follow up with Jacolyn Reedy, PA on 04/09/2012. (10:45 AM - Dr. Rosalyn Charters PA)    Contact information:   [address], [address] [place] [address] [phone]       Follow up with [facility] on 04/02/2012. (For psychiatry follow-up as scheduled.)         Discharge Medications  Medication List  As of 03/26/2012  2:02 PM   START taking these  medications         benztropine 0.5 MG tablet   Commonly known as: COGENTIN   Take 1 tablet (0.5 mg total) by mouth 2 (two) times daily.      clopidogrel 75 MG tablet **To be started after you complete your home supply of Brilinta**   Commonly known as: PLAVIX   Take 1 tablet (75 mg total) by mouth daily.      fluPHENAZine 2.5 MG tablet   Commonly known as: PROLIXIN   Take 1 tablet (2.5 mg total) by mouth 2 (two) times daily.         CONTINUE taking these medications         aspirin 81 MG chewable tablet   Chew 1 tablet (81 mg total) by mouth daily.      atorvastatin 80 MG tablet   Commonly known as: LIPITOR   Take 1 tablet (80 mg total) by mouth at bedtime.      insulin NPH-insulin regular (70-30) 100 UNIT/ML injection   Commonly known as: NOVOLIN 70/30  Inject 15-20 Units into the skin 2 (two) times daily. 15 units in the morning  20 units in the evening      insulin regular 100 units/mL injection   Commonly known as: NOVOLIN R,HUMULIN R      metoprolol tartrate 25 MG tablet   Commonly known as: LOPRESSOR   Take 1 tablet (25 mg total) by mouth 2 (two) times daily.      nitroGLYCERIN 0.4 MG SL tablet   Commonly known as: NITROSTAT   Place 1 tablet (0.4 mg total) under the tongue every 5 (five) minutes as needed for chest pain (up to 3 doses).      Ticagrelor 90 MG Tabs tablet - **Take this as prescribed until you finish your current home supply and then begin plavix (clopidogrel) 75mg  daily**   Commonly known as: BRILINTA   Take 1 tablet (90 mg total) by mouth 2 (two) times daily.         Outstanding Labs/Studies  None  Duration of Discharge Encounter   Greater than 30 minutes including physician time.  Signed, Nicolasa Ducking NP 03/26/2012, 1:40 PM

## 2012-03-26 NOTE — Progress Notes (Signed)
CARDIAC REHAB PHASE I   PRE:  Rate/Rhythm: 57 SB    BP: sitting 90/70    SaO2:   MODE:  Ambulation: 500 ft   POST:  Rate/Rhythm: 70    BP: sitting 100/60     SaO2: 100 RA  Sleepy. Agreeable. Tolerated fine. Ed briefly done. Pt excited about [facility] in [place] as it will give him something to do/look forward to. Will send referral. [id number]  Harriet Masson CES, ACSM

## 2012-03-26 NOTE — Progress Notes (Signed)
Pt  c/o nausea and chest pain with some tightness, cold feeling to the left side of his chest and some tingling in his fingers, B/P obtained and within normal range, EKG obtained and results shows NSR. Nitroglycerin 0.4mg  SL was given to Pt  X 2 with positive result. Pt resting at this time, will continue plan of care.

## 2012-03-26 NOTE — Plan of Care (Signed)
Problem: Phase II Progression Outcomes Goal: Other Discharge Outcomes/Goals Outcome: Completed/Met Date Met:  03/26/12 Discharge complicated by pt's situation with insurance and recently being started on a new psych med that pt cannot afford.  Bruce [name] spent a long while resolving these issues in order to provide pt with meds he can remain compliant with.  Pt gets frustrated dealing with these complications.

## 2012-03-26 NOTE — Telephone Encounter (Signed)
Dr Riley Kill aware. This pt also no showed for 03/14/12 post-hospital appointment.  The pt's appointment has been rescheduled to 04/09/12.

## 2012-03-26 NOTE — Progress Notes (Addendum)
Progress note for pt with PTSD, MDD severe, recurrent with multiple suicide attempts and recent MI:  Contacted by Cardiology, Sharlene Motts  NP in anticipation of discharge where pt had experienced inability to afford Rx Latuda.  Discussed generic options that would be less expensive.   RECOMMENDATION:  1.  Suggest replacement of SGA Latuda with FGA, perphenizine PROLIXIN 2.5mg  or 5 mg BID with benztropine COGENTIN 0.5 mg BID to prevent TD or EPS.   2.  Encourage pt to take Rx as prescribed and notify if he experiences any side effects 3.  Plan: pt is to see psychiatrist April 01, 2012. 4. Psych CSW has made arrangements for home health care.  Harleen Fineberg J. Ferol Luz, MD Psychiatrist . 03/26/2012 3:01 PM

## 2012-03-26 NOTE — Progress Notes (Signed)
Subjective:  Stable status.  Mild chest pain overnight. Feels great this morning. He seems to have better understanding of overall health issues.  Objective:  Vital Signs in the last 24 hours: Temp:  [97.6 F (36.4 C)-98.4 F (36.9 C)] 97.7 F (36.5 C) (07/10 0518) Pulse Rate:  [60-67] 60  (07/10 0518) Resp:  [16-18] 17  (07/10 0518) BP: (100-117)/(66-80) 100/66 mmHg (07/10 0518) SpO2:  [97 %-100 %] 98 % (07/10 0518)  Intake/Output from previous day: 07/09 0701 - 07/10 0700 In: 480 [P.O.:480] Out: -    Physical Exam: General: Well developed, well nourished, in no acute distress. Head:  Normocephalic and atraumatic. Lungs: Clear to auscultation and percussion. Heart: Normal S1 and S2.  No murmur, rubs or gallops.  Pulses: Pulses normal in all 4 extremities. Extremities: No clubbing or cyanosis. No edema. Neurologic: Alert and oriented x 3.    Lab Results:  Basename 03/26/12 0515 03/25/12 0815  WBC 8.0 8.5  HGB 15.6 16.2  PLT 132* 136*    Basename 03/26/12 0515 03/25/12 0815  NA 138 136  K 3.9 3.3*  CL 102 98  CO2 24 26  GLUCOSE 109* 182*  BUN 14 12  CREATININE 0.87 0.93   No results found for this basename: TROPONINI:2,CK,MB:2 in the last 72 hours Hepatic Function Panel No results found for this basename: PROT,ALBUMIN,AST,ALT,ALKPHOS,BILITOT,BILIDIR,IBILI in the last 72 hours No results found for this basename: CHOL in the last 72 hours No results found for this basename: PROTIME in the last 72 hours  Imaging: No results found.   Assessment/Plan:  Patient Active Hospital Problem List: 1. ST elevation myocardial infarction (STEMI) of inferior wall, subsequent episode of care (03/21/2012)   Assessment: improved. S/p RCA PCI   Plan: continue ambulation and in patient rehab.  Can discharge home today if follow up and social issues are resolved. He needs to be discharged on all generic medications. Can continue Brillinta until he finishes current supplies than  start Clopidogrel 75 mg once daily.  Follow up with [place] in 1-2 weeks.    2. Schizophrenia (03/05/2012)   Assessment: see psych note   Plan: increase meds as recommended.  3. Hypertension ()   Assessment: stable and controlled   Plan: no change.  NO ACE to keep meds simple Noncompliance with medications (03/21/2012)   Assessment: will be ongoing issue   Plan: encourage Type 2 diabetes mellitus (03/21/2012)   Assessment: glucose modest control.     Plan: adjust meds.        Lorine Bears, MD, [facility]  03/26/2012, 10:22 AM

## 2012-03-27 NOTE — Telephone Encounter (Signed)
Difficult issues with patient.  He was seen by psychiatry in the hospital, and refused inpatient admission at behavioral health.  Case managers arranged outpatient psychiatry but the patient had issues with the arrangements after discharge.  He has had repeated issues with the [facility] psychiatry treatment per the patient, and also has other issues including insulin overdoses leading to ER visits.  I had spoken with his daughter, but she is taking care of another ill relative in [place] and could not come.  Home health was arranged to attempt to improve medication compliance and check on status.  The results are as noted.

## 2012-03-31 ENCOUNTER — Telehealth: Payer: Self-pay | Admitting: Cardiology

## 2012-03-31 NOTE — Telephone Encounter (Signed)
Bruce [name] from [facility] called wanting to get a order for 4 more weeks of home health and a order for a Child psychotherapist.Spoke to Dr.Stuckey he gave order for 4 more weeks of home health and Child psychotherapist.Returned call to [employer] no [employer] on personal voice mail for orders.

## 2012-03-31 NOTE — Telephone Encounter (Signed)
New problem:  Per [facility]    Referral for home care services. Will Dr. Riley Kill O.K. Plan of care for the next couple of week.

## 2012-04-01 ENCOUNTER — Telehealth: Payer: Self-pay | Admitting: Cardiology

## 2012-04-01 NOTE — Telephone Encounter (Signed)
Spoke to [facility] she wanted to report patient has 2 new bruises on arm and 7 bruises on his abdomen.Social worker will be going out to see patient, to see if she can help him with his medication.States patient only has brilanta,aspirin,insulin.Patient has more prescriptions he is unable to fill.Dr.Stuckey out of office today will check with DOD about the bruising.

## 2012-04-01 NOTE — Telephone Encounter (Signed)
Spoke to Physical Therapy Lawson Fiscal she needs order for patient to have occupational therapy and a order for physical therapy for once a week for 1 week then 2 x week for 5 more weeks.Call her at [phone].Message sent to Dr.Stuckey's nurse.

## 2012-04-01 NOTE — Telephone Encounter (Signed)
Spoke to [facility] was told spoke to Norma Fredrickson NP she advised to decrease aspirin to 1 daily,CBC,send nurse out to home to draw cbc patient was told not to drive and he has no way to come to office.

## 2012-04-01 NOTE — Telephone Encounter (Signed)
New msg Social worker wants to discuss eval that he had today. Please call back

## 2012-04-01 NOTE — Telephone Encounter (Signed)
New problem:  Per Bruce [name] - physical therapy called   1.regarding his medication brilinta 90 mg twice a day. 2 new bruise on arm 7 stomach. Unaware where they came from.also on ASA 81 mg twice a day.    2. Need occupation  therapy  eval, have other medication  that he unable to filled. Social worker is coming out to home today.

## 2012-04-01 NOTE — Telephone Encounter (Signed)
Social Worker called no answer.LMTC.

## 2012-04-01 NOTE — Telephone Encounter (Signed)
Lori from PT called no answer.LMTC.

## 2012-04-02 ENCOUNTER — Telehealth: Payer: Self-pay | Admitting: *Deleted

## 2012-04-02 ENCOUNTER — Encounter: Payer: Self-pay | Admitting: Cardiology

## 2012-04-02 NOTE — Telephone Encounter (Signed)
Bruce [name] psychotherapist out to see patient and requesting 3 additional visits, verbal ok given

## 2012-04-07 NOTE — Telephone Encounter (Signed)
Left message to call back  

## 2012-04-07 NOTE — Telephone Encounter (Signed)
I spoke with Lori and she needs to know if it is okay to get an order for PT 1 time for 1 week and the 2 times for 5 weeks, PT for energy conservation and gait and balance.  Order given by phone and they will fax order for MD signature.  

## 2012-04-07 NOTE — Telephone Encounter (Signed)
Fu call Lawson Fiscal called back again to clarify physical therapy orders

## 2012-04-07 NOTE — Telephone Encounter (Signed)
I spoke with Bruce [name] and she needs to know if it is okay to get an order for PT 1 time for 1 week and the 2 times for 5 weeks, PT for energy conservation and gait and balance.  Order given by phone and they will fax order for MD signature.

## 2012-04-09 ENCOUNTER — Encounter: Payer: Self-pay | Admitting: Physician Assistant

## 2012-04-09 ENCOUNTER — Ambulatory Visit (INDEPENDENT_AMBULATORY_CARE_PROVIDER_SITE_OTHER): Payer: Medicare Other | Admitting: Physician Assistant

## 2012-04-09 ENCOUNTER — Telehealth: Payer: Self-pay | Admitting: Cardiology

## 2012-04-09 VITALS — BP 110/84 | HR 102 | Ht 64.0 in | Wt 172.0 lb

## 2012-04-09 DIAGNOSIS — F209 Schizophrenia, unspecified: Secondary | ICD-10-CM

## 2012-04-09 DIAGNOSIS — I2119 ST elevation (STEMI) myocardial infarction involving other coronary artery of inferior wall: Secondary | ICD-10-CM

## 2012-04-09 DIAGNOSIS — I10 Essential (primary) hypertension: Secondary | ICD-10-CM

## 2012-04-09 NOTE — Telephone Encounter (Signed)
I spoke with the home health nurse and she reports that patient was seen in our office and is currently complaining of chest pain and SOB. His resp. Rate is 20 to 22. He did not fill the Lopressor script yet. His chest pain is not like it was when he went to the hospital. Advised her to make sure he restarts the Lopressor.  She will assess the situation further because she is currently with this patient. Also advised her to have him arrange for a family member or friend to drive him to the [place] to pick up the rest of his medications.

## 2012-04-09 NOTE — Assessment & Plan Note (Addendum)
Patient suffered another MI with total occlusion in the previously placed proximal RCA stent with large thrombus burden. This was treated with thrombectomy and balloon angioplasty on 7/13 this happened after he stopped his Brilinta.he currently has less than a weeks supply of this drug left. We have given him 12 day supply from our office which is all we had. He thinks he'll be able to get the Plavix filled after that. He is tachycardic in the office today. I asked him if he could please fill his metoprolol which is a $4 prescription but he says he cannot do this until the [place] paste were done August 1. We have also given him samples of aspirin.

## 2012-04-09 NOTE — Assessment & Plan Note (Signed)
Blood pressure is stable despite not being on medication

## 2012-04-09 NOTE — Telephone Encounter (Signed)
I made Dr Riley Kill aware that the home health nurse contacted our office because the pt was having CP and SOB.  Dr Riley Kill instructed me to contact the pt.  I spoke with the pt and he did have CP and SOB this afternoon but he feels like his symptoms are subsiding.  I made the pt aware that because he has not been taking his medications as advised this can cause complications with the blood flow to his heart muscle. Dr Riley Kill recommends that the pt go back to the ER for evaluation.  At this time the pt feels like he dose not need to seek evaluation at the ER.  I made Dr Riley Kill aware of this information.

## 2012-04-09 NOTE — Patient Instructions (Addendum)
Take your Brilinta until you run out of doses.  Samples have been provided for you.  Then switch to PLAVIX.  A discount card has been provided to you for this.  Please fill your Lopressor.    Your physician recommends that you schedule a follow-up appointment in: 3-4 weeks with Dr. Riley Kill.

## 2012-04-09 NOTE — Assessment & Plan Note (Signed)
Patient was started on new psychiatric medications will not Hospital but he cannot afford them and has not taken them since he's been home. Followup at the [facility].

## 2012-04-09 NOTE — Telephone Encounter (Signed)
New problem:  Per [name] calling at patient home now. Patient was seen today by Herma Carson  PA. C/O chest pain. B/p 132/80.
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
## 2012-04-09 NOTE — Telephone Encounter (Signed)
New msg Pt home health nurse called back about visit today. She wants to talk to a nurse

## 2012-04-14 ENCOUNTER — Telehealth: Payer: Self-pay | Admitting: Cardiology
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
## 2012-04-14 NOTE — Telephone Encounter (Signed)
New msg Pt wants to be discharged from [facility] please call

## 2012-04-15 ENCOUNTER — Telehealth: Payer: Self-pay | Admitting: Cardiology

## 2012-04-15 NOTE — Telephone Encounter (Signed)
error 

## 2012-04-17 ENCOUNTER — Ambulatory Visit (HOSPITAL_COMMUNITY): Payer: Medicare Other

## 2012-04-17 ENCOUNTER — Telehealth: Payer: Self-pay | Admitting: Cardiology

## 2012-04-17 NOTE — Telephone Encounter (Signed)
FYI:   Per [name] RN- [facility]   Patient missed home visit  04/14/12 due to refusal   Patient was seen by RN 04/16/12 and he is doing fine

## 2012-04-17 NOTE — Telephone Encounter (Signed)
Thank you  for update

## 2012-04-21 ENCOUNTER — Ambulatory Visit (HOSPITAL_COMMUNITY): Payer: Medicare Other

## 2012-04-23 ENCOUNTER — Ambulatory Visit (HOSPITAL_COMMUNITY): Payer: Medicare Other

## 2012-04-25 ENCOUNTER — Ambulatory Visit (HOSPITAL_COMMUNITY): Payer: Medicare Other

## 2012-04-28 ENCOUNTER — Ambulatory Visit (HOSPITAL_COMMUNITY): Payer: Medicare Other

## 2012-04-30 ENCOUNTER — Ambulatory Visit (HOSPITAL_COMMUNITY): Payer: Medicare Other

## 2012-05-02 ENCOUNTER — Ambulatory Visit (HOSPITAL_COMMUNITY): Payer: Medicare Other

## 2012-05-05 ENCOUNTER — Ambulatory Visit (HOSPITAL_COMMUNITY): Payer: Medicare Other

## 2012-05-07 ENCOUNTER — Encounter: Payer: Self-pay | Admitting: Cardiology

## 2012-05-07 ENCOUNTER — Ambulatory Visit (INDEPENDENT_AMBULATORY_CARE_PROVIDER_SITE_OTHER): Payer: Medicare Other | Admitting: Cardiology

## 2012-05-07 ENCOUNTER — Ambulatory Visit (HOSPITAL_COMMUNITY): Payer: Medicare Other

## 2012-05-07 VITALS — BP 122/70 | HR 71 | Ht 64.0 in | Wt 178.1 lb

## 2012-05-07 DIAGNOSIS — I10 Essential (primary) hypertension: Secondary | ICD-10-CM

## 2012-05-07 DIAGNOSIS — D696 Thrombocytopenia, unspecified: Secondary | ICD-10-CM

## 2012-05-07 DIAGNOSIS — F209 Schizophrenia, unspecified: Secondary | ICD-10-CM

## 2012-05-07 DIAGNOSIS — I2510 Atherosclerotic heart disease of native coronary artery without angina pectoris: Secondary | ICD-10-CM

## 2012-05-07 DIAGNOSIS — E785 Hyperlipidemia, unspecified: Secondary | ICD-10-CM

## 2012-05-07 NOTE — Patient Instructions (Addendum)
Please start Plavix 75mg  once a day when you finish your current supply of Brilinta.  Your physician recommends that you schedule a follow-up appointment in: 4 WEEKS with Dr Riley Kill  Your physician recommends that you have lab work today: LIPID, LIVER, BMP and CBC

## 2012-05-07 NOTE — Assessment & Plan Note (Signed)
The patient had stent thrombosis. He stopped his medicines, somewhat improved test. He had not taken any of his schizophrenia medications. He was readmitted, and had to be reopened. I've encouraged him to stay on limited, and we've given him another month supply of samples. We will see him back in 4 weeks, and potentially get him more samples. I would like to get him up to the 3 month mark on the more potent ADP inhibitor because of def ST, but will probably switch at three months because of prohibitive cost.  Denies chest pain.
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
## 2012-05-07 NOTE — Assessment & Plan Note (Signed)
He'll be appropriate to recheck the patient's platelet count at  this point in time

## 2012-05-07 NOTE — Assessment & Plan Note (Signed)
Check patient's lipid and liver profile.

## 2012-05-07 NOTE — Assessment & Plan Note (Signed)
He plans to go back to the [place], and is in the process of making these arrangements. He says he will have a new doctor.

## 2012-05-07 NOTE — Assessment & Plan Note (Signed)
We will recheck the patient's potassium

## 2012-05-08 LAB — HEPATIC FUNCTION PANEL
Albumin: 4.4 g/dL (ref 3.5–5.2)
Alkaline Phosphatase: 73 U/L (ref 39–117)
Bilirubin, Direct: 0.1 mg/dL (ref 0.0–0.3)
Total Bilirubin: 0.5 mg/dL (ref 0.3–1.2)

## 2012-05-08 LAB — BASIC METABOLIC PANEL
BUN: 13 mg/dL (ref 6–23)
CO2: 29 mEq/L (ref 19–32)
Chloride: 98 mEq/L (ref 96–112)
Creatinine, Ser: 0.8 mg/dL (ref 0.4–1.5)
Glucose, Bld: 347 mg/dL — ABNORMAL HIGH (ref 70–99)
Potassium: 4.1 mEq/L (ref 3.5–5.1)

## 2012-05-08 LAB — CBC WITH DIFFERENTIAL/PLATELET
Basophils Relative: 0.3 % (ref 0.0–3.0)
Eosinophils Relative: 5.3 % — ABNORMAL HIGH (ref 0.0–5.0)
Hemoglobin: 14.3 g/dL (ref 13.0–17.0)
Lymphocytes Relative: 28.9 % (ref 12.0–46.0)
Neutro Abs: 4.6 10*3/uL (ref 1.4–7.7)
Neutrophils Relative %: 59.4 % (ref 43.0–77.0)
RBC: 4.43 Mil/uL (ref 4.22–5.81)
WBC: 7.8 10*3/uL (ref 4.5–10.5)

## 2012-05-08 LAB — LIPID PANEL
Total CHOL/HDL Ratio: 6
VLDL: 83.2 mg/dL — ABNORMAL HIGH (ref 0.0–40.0)

## 2012-05-09 ENCOUNTER — Ambulatory Visit (HOSPITAL_COMMUNITY): Payer: Medicare Other

## 2012-05-12 ENCOUNTER — Ambulatory Visit (HOSPITAL_COMMUNITY): Payer: Medicare Other

## 2012-05-14 ENCOUNTER — Ambulatory Visit (HOSPITAL_COMMUNITY): Payer: Medicare Other

## 2012-05-16 ENCOUNTER — Ambulatory Visit (HOSPITAL_COMMUNITY): Payer: Medicare Other

## 2012-05-19 ENCOUNTER — Ambulatory Visit (HOSPITAL_COMMUNITY): Payer: Medicare Other

## 2012-05-21 ENCOUNTER — Ambulatory Visit (HOSPITAL_COMMUNITY): Payer: Medicare Other

## 2012-05-23 ENCOUNTER — Ambulatory Visit (HOSPITAL_COMMUNITY): Payer: Medicare Other

## 2012-05-26 ENCOUNTER — Encounter: Payer: Self-pay | Admitting: Cardiology

## 2012-05-26 ENCOUNTER — Ambulatory Visit (HOSPITAL_COMMUNITY): Payer: Medicare Other

## 2012-05-26 NOTE — Telephone Encounter (Signed)
Pt is reting your call

## 2012-05-26 NOTE — Telephone Encounter (Signed)
This encounter was created in error - please disregard.

## 2012-05-28 ENCOUNTER — Ambulatory Visit (HOSPITAL_COMMUNITY): Payer: Medicare Other

## 2012-05-30 ENCOUNTER — Ambulatory Visit (HOSPITAL_COMMUNITY): Payer: Medicare Other

## 2012-06-02 ENCOUNTER — Ambulatory Visit (HOSPITAL_COMMUNITY): Payer: Medicare Other

## 2012-06-04 ENCOUNTER — Ambulatory Visit (HOSPITAL_COMMUNITY): Payer: Medicare Other

## 2012-06-04 ENCOUNTER — Ambulatory Visit (INDEPENDENT_AMBULATORY_CARE_PROVIDER_SITE_OTHER): Payer: Medicare Other | Admitting: Cardiology

## 2012-06-04 ENCOUNTER — Encounter: Payer: Self-pay | Admitting: Cardiology

## 2012-06-04 VITALS — BP 136/76 | HR 69 | Resp 18 | Ht 63.0 in | Wt 177.1 lb

## 2012-06-04 DIAGNOSIS — I2510 Atherosclerotic heart disease of native coronary artery without angina pectoris: Secondary | ICD-10-CM

## 2012-06-04 DIAGNOSIS — E119 Type 2 diabetes mellitus without complications: Secondary | ICD-10-CM

## 2012-06-04 NOTE — Patient Instructions (Addendum)
Your physician recommends that you return for lab work in: BMP, CBC and HgbA1c (06/10/12, 8:30-1:30 and 2:30-4:30)  Your physician has recommended you make the following change in your medication: STOP Lipitor (Atorvastatin)  Your physician recommends that you schedule a follow-up appointment in: 1 MONTH with Dr Riley Kill  Please contact the [facility] and schedule an appointment for general care.

## 2012-06-05 ENCOUNTER — Telehealth: Payer: Self-pay | Admitting: Cardiology
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
## 2012-06-05 NOTE — Telephone Encounter (Signed)
Left message for pt to call back.  The [facility].

## 2012-06-06 ENCOUNTER — Ambulatory Visit (HOSPITAL_COMMUNITY): Payer: Medicare Other
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
## 2012-06-07 NOTE — Assessment & Plan Note (Signed)
This is a very difficult situation. He was managed in the hospital by the hospitalists. He has no general medical care, and states that he can't get to the [place]. However, he is related to the [place] largely a reimbursement basis. Cautioned him with whether or not he could get to the [facility] facility, but he expresses some frustration with all of this with regard access. How much is related to his underlying mental health issues, and how much actually this represents is difficult to determine. He promised me he would try to give me a related general medical appointment as his medicines are also linked to this. We will need to continue to follow him closely.

## 2012-06-07 NOTE — Assessment & Plan Note (Signed)
Patient had a recurrent cardiac event, and a recurrent readmission largely related to a conscious decision to discontinue his dual antiplatelet therapy shortly after his myocardial infarction. Various members of the hospital staff, primarily from care management and transition nurses on the floor made every attempt to try to get him long-term continuing care. He was seen by psychiatry, and they made the recommendation for initial inpatient management, but the patient refused, but was not felt to the he was prescribed medication for schizophrenia, but did not take it as he said he could not get. At the present time, he remains on dual antiplatelet therapy, and given his diabetes we're using a second-generation agent.

## 2012-06-09 ENCOUNTER — Ambulatory Visit (HOSPITAL_COMMUNITY): Payer: Medicare Other

## 2012-06-10 ENCOUNTER — Other Ambulatory Visit (INDEPENDENT_AMBULATORY_CARE_PROVIDER_SITE_OTHER): Payer: Medicare Other

## 2012-06-10 DIAGNOSIS — I2510 Atherosclerotic heart disease of native coronary artery without angina pectoris: Secondary | ICD-10-CM

## 2012-06-10 DIAGNOSIS — E119 Type 2 diabetes mellitus without complications: Secondary | ICD-10-CM

## 2012-06-10 LAB — CBC WITH DIFFERENTIAL/PLATELET
Basophils Absolute: 0 10*3/uL (ref 0.0–0.1)
HCT: 43.5 % (ref 39.0–52.0)
Hemoglobin: 14.6 g/dL (ref 13.0–17.0)
Lymphs Abs: 1.4 10*3/uL (ref 0.7–4.0)
MCV: 94.7 fl (ref 78.0–100.0)
Monocytes Relative: 7.6 % (ref 3.0–12.0)
Neutro Abs: 4.2 10*3/uL (ref 1.4–7.7)
RDW: 13.2 % (ref 11.5–14.6)

## 2012-06-10 LAB — BASIC METABOLIC PANEL
CO2: 26 mEq/L (ref 19–32)
Chloride: 97 mEq/L (ref 96–112)
GFR: 82.38 mL/min (ref >60.00)
Glucose, Bld: 452 mg/dL — ABNORMAL HIGH (ref 70–99)
Potassium: 4.2 mEq/L (ref 3.5–5.1)
Sodium: 138 mEq/L (ref 135–145)

## 2012-06-11 ENCOUNTER — Telehealth: Payer: Self-pay | Admitting: Cardiology

## 2012-06-11 ENCOUNTER — Ambulatory Visit (HOSPITAL_COMMUNITY): Payer: Medicare Other

## 2012-06-11 NOTE — Telephone Encounter (Signed)
Walk In pt Form " Pt Following up on Call" gave Message to Lauren 06/11/12/Km

## 2012-06-11 NOTE — Telephone Encounter (Signed)
I spoke with the pt and he is trying to arrange follow-up with the [place] but is having issues with transportation.  At this time the pt has not set an appointment date with the [place].  I emphasized the importance of [facility] follow-[facility] and the pt agreed that [facility] is a [facility] of his care. The pt plans on contacting the [place] again to schedule appointment.

## 2012-06-13 ENCOUNTER — Ambulatory Visit (HOSPITAL_COMMUNITY): Payer: Medicare Other

## 2012-06-16 ENCOUNTER — Ambulatory Visit (HOSPITAL_COMMUNITY): Payer: Medicare Other

## 2012-06-18 ENCOUNTER — Ambulatory Visit (HOSPITAL_COMMUNITY): Payer: Medicare Other

## 2012-06-20 ENCOUNTER — Ambulatory Visit (HOSPITAL_COMMUNITY): Payer: Medicare Other

## 2012-06-23 ENCOUNTER — Ambulatory Visit (HOSPITAL_COMMUNITY): Payer: Medicare Other

## 2012-06-25 ENCOUNTER — Ambulatory Visit (HOSPITAL_COMMUNITY): Payer: Medicare Other

## 2012-06-27 ENCOUNTER — Ambulatory Visit (HOSPITAL_COMMUNITY): Payer: Medicare Other

## 2012-06-30 ENCOUNTER — Encounter (HOSPITAL_COMMUNITY): Payer: Self-pay

## 2012-06-30 ENCOUNTER — Emergency Department (HOSPITAL_COMMUNITY): Payer: Medicare Other

## 2012-06-30 ENCOUNTER — Emergency Department (HOSPITAL_COMMUNITY)
Admission: EM | Admit: 2012-06-30 | Discharge: 2012-06-30 | Disposition: A | Discharge status: Home / Self Care | Payer: Medicare Other | Attending: Emergency Medicine | Admitting: Emergency Medicine

## 2012-06-30 ENCOUNTER — Ambulatory Visit (HOSPITAL_COMMUNITY): Payer: Medicare Other

## 2012-06-30 DIAGNOSIS — I10 Essential (primary) hypertension: Secondary | ICD-10-CM | POA: Insufficient documentation

## 2012-06-30 DIAGNOSIS — E119 Type 2 diabetes mellitus without complications: Secondary | ICD-10-CM

## 2012-06-30 DIAGNOSIS — I2510 Atherosclerotic heart disease of native coronary artery without angina pectoris: Secondary | ICD-10-CM | POA: Insufficient documentation

## 2012-06-30 DIAGNOSIS — R10819 Abdominal tenderness, unspecified site: Secondary | ICD-10-CM | POA: Insufficient documentation

## 2012-06-30 DIAGNOSIS — R109 Unspecified abdominal pain: Secondary | ICD-10-CM | POA: Insufficient documentation

## 2012-06-30 DIAGNOSIS — R0602 Shortness of breath: Secondary | ICD-10-CM | POA: Insufficient documentation

## 2012-06-30 DIAGNOSIS — R531 Weakness: Secondary | ICD-10-CM

## 2012-06-30 DIAGNOSIS — R42 Dizziness and giddiness: Secondary | ICD-10-CM

## 2012-06-30 DIAGNOSIS — I252 Old myocardial infarction: Secondary | ICD-10-CM | POA: Insufficient documentation

## 2012-06-30 DIAGNOSIS — Z794 Long term (current) use of insulin: Secondary | ICD-10-CM | POA: Insufficient documentation

## 2012-06-30 DIAGNOSIS — R5381 Other malaise: Secondary | ICD-10-CM | POA: Insufficient documentation

## 2012-06-30 LAB — CBC
MCHC: 35.5 g/dL (ref 30.0–36.0)
RDW: 12.7 % (ref 11.5–15.5)

## 2012-06-30 LAB — URINALYSIS, ROUTINE W REFLEX MICROSCOPIC
Glucose, UA: 250 mg/dL — AB
Hgb urine dipstick: NEGATIVE
Specific Gravity, Urine: 1.012 (ref 1.005–1.030)

## 2012-06-30 LAB — COMPREHENSIVE METABOLIC PANEL
ALT: 34 U/L (ref 0–53)
Alkaline Phosphatase: 80 U/L (ref 39–117)
CO2: 25 mEq/L (ref 19–32)
GFR calc Af Amer: >90 mL/min (ref >90)
GFR calc non Af Amer: >90 mL/min (ref >90)
Glucose, Bld: 242 mg/dL — ABNORMAL HIGH (ref 70–99)
Potassium: 3.4 mEq/L — ABNORMAL LOW (ref 3.5–5.1)
Sodium: 134 mEq/L — ABNORMAL LOW (ref 135–145)

## 2012-06-30 MED ORDER — MECLIZINE HCL 25 MG PO TABS
25.0000 mg | ORAL_TABLET | Freq: Once | ORAL | Status: AC
  Administered 2012-06-30: 25 mg via ORAL
  Filled 2012-06-30: qty 1

## 2012-06-30 MED ORDER — MECLIZINE HCL 25 MG PO TABS: 25.0000 mg | ORAL_TABLET | Freq: Three times a day (TID) | ORAL | Status: DC | PRN

## 2012-06-30 NOTE — ED Notes (Signed)
Pt presents with NAD- reports dizziness all day.  Has not improved.  Pt reports not eating well for several months.  Denies CP/SOB
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
## 2012-07-02 ENCOUNTER — Ambulatory Visit (HOSPITAL_COMMUNITY): Payer: Medicare Other

## 2012-07-04 ENCOUNTER — Ambulatory Visit (HOSPITAL_COMMUNITY): Payer: Medicare Other

## 2012-07-07 ENCOUNTER — Ambulatory Visit (HOSPITAL_COMMUNITY): Payer: Medicare Other

## 2012-07-08 ENCOUNTER — Ambulatory Visit: Payer: Medicare Other | Admitting: Cardiology

## 2012-07-09 ENCOUNTER — Ambulatory Visit (HOSPITAL_COMMUNITY): Payer: Medicare Other

## 2012-07-11 ENCOUNTER — Ambulatory Visit (HOSPITAL_COMMUNITY): Payer: Medicare Other

## 2012-07-14 ENCOUNTER — Ambulatory Visit (HOSPITAL_COMMUNITY): Payer: Medicare Other

## 2012-07-16 ENCOUNTER — Ambulatory Visit (HOSPITAL_COMMUNITY): Payer: Medicare Other

## 2012-07-18 ENCOUNTER — Ambulatory Visit (HOSPITAL_COMMUNITY): Payer: Medicare Other

## 2012-07-18 HISTORY — PX: CARDIAC CATHETERIZATION: SHX172

## 2012-07-21 ENCOUNTER — Ambulatory Visit (HOSPITAL_COMMUNITY): Payer: Medicare Other

## 2012-07-21 ENCOUNTER — Ambulatory Visit (INDEPENDENT_AMBULATORY_CARE_PROVIDER_SITE_OTHER): Payer: Medicare Other | Admitting: Cardiology

## 2012-07-21 VITALS — BP 129/86 | HR 100 | Ht 64.0 in | Wt 176.0 lb

## 2012-07-21 DIAGNOSIS — I10 Essential (primary) hypertension: Secondary | ICD-10-CM

## 2012-07-21 DIAGNOSIS — F200 Paranoid schizophrenia: Secondary | ICD-10-CM

## 2012-07-21 DIAGNOSIS — I2510 Atherosclerotic heart disease of native coronary artery without angina pectoris: Secondary | ICD-10-CM

## 2012-07-21 DIAGNOSIS — E785 Hyperlipidemia, unspecified: Secondary | ICD-10-CM

## 2012-07-21 MED ORDER — METOPROLOL TARTRATE 25 MG PO TABS: 25.0000 mg | ORAL_TABLET | Freq: Two times a day (BID) | ORAL | Status: DC
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
## 2012-07-21 NOTE — Patient Instructions (Addendum)
Your physician has recommended you make the following change in your medication: START Metoprolol Tartrate 25mg  take one by mouth twice a day  Your physician recommends that you schedule a follow-up appointment on Wednesday, July 23, 2012 at 3:30

## 2012-07-22 NOTE — Assessment & Plan Note (Addendum)
Very difficult situation.  Resources for care are at the [place], and he has problems with transportation.  Did not keep appointments here.  Has no primary care other than the [place] system.  Discussed. He got angry with them when he found they said he was non compliant.

## 2012-07-22 NOTE — Assessment & Plan Note (Signed)
HR and BP both up a bit.  Not on his meds.  Will start metoprolol at prescribed doses.

## 2012-07-22 NOTE — Assessment & Plan Note (Signed)
Difficult to know his current status.  He has agreed to get the metoprolol and start treatment again.  He will see [place] again in the clinic in two days.  Should he have more problems he is to go to the ER if he has further pain.  If not, we will see him again in two days.

## 2012-07-22 NOTE — Assessment & Plan Note (Signed)
Influenced by DM and poor medication compliance.

## 2012-07-23 ENCOUNTER — Ambulatory Visit (HOSPITAL_COMMUNITY): Payer: Medicare Other

## 2012-07-23 ENCOUNTER — Ambulatory Visit (INDEPENDENT_AMBULATORY_CARE_PROVIDER_SITE_OTHER): Payer: Medicare Other | Admitting: Cardiology

## 2012-07-23 ENCOUNTER — Encounter: Payer: Self-pay | Admitting: Cardiology

## 2012-07-23 VITALS — BP 122/74 | HR 73 | Ht 64.0 in | Wt 175.0 lb

## 2012-07-23 DIAGNOSIS — E119 Type 2 diabetes mellitus without complications: Secondary | ICD-10-CM

## 2012-07-23 DIAGNOSIS — I10 Essential (primary) hypertension: Secondary | ICD-10-CM

## 2012-07-23 DIAGNOSIS — I2510 Atherosclerotic heart disease of native coronary artery without angina pectoris: Secondary | ICD-10-CM

## 2012-07-23 DIAGNOSIS — E785 Hyperlipidemia, unspecified: Secondary | ICD-10-CM

## 2012-07-23 HISTORY — PX: CORONARY ANGIOPLASTY: SHX604

## 2012-07-23 NOTE — Assessment & Plan Note (Addendum)
Given the nature of his symptoms, it would  be best to rogress with cardiac catheterization. He has overlapping stents in the right, and did have stent thrombosis. Chances of restenosis are quite a bit higher and the circumstances. He's not unstable, and he is does not have resting ECG abnormalities. He is agreeable to come in and 2 days for repeat cardiac catheterization. He is aware of the risks having undergone catheterization twice before, and these been explained to the patient.

## 2012-07-23 NOTE — Assessment & Plan Note (Signed)
His a.m. insulin. He will take only half of a 7030 in the morning and he can have some juice coming in. He is agreeable to proceed. Should he have any increasing symptoms his calls promptly to the emergency

## 2012-07-23 NOTE — Patient Instructions (Addendum)
Your physician has requested that you have a cardiac catheterization. Cardiac catheterization is used to diagnose and/or treat various heart conditions. Doctors may recommend this procedure for a number of different reasons. The most common reason is to evaluate chest pain. Chest pain can be a symptom of coronary artery disease (CAD), and cardiac catheterization can show whether plaque is narrowing or blocking your heart's arteries. This procedure is also used to evaluate the valves, as well as measure the blood flow and oxygen levels in different parts of your heart. For further information please visit www.cardiosmart.org. Please follow instruction sheet, as given.   Your physician recommends that you continue on your current medications as directed. Please refer to the Current Medication list given to you today.  
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
## 2012-07-24 ENCOUNTER — Encounter (HOSPITAL_COMMUNITY): Payer: Self-pay | Admitting: Pharmacy Technician

## 2012-07-24 ENCOUNTER — Telehealth: Payer: Self-pay | Admitting: Cardiology

## 2012-07-24 LAB — CBC WITH DIFFERENTIAL/PLATELET
Basophils Absolute: 0 10*3/uL (ref 0.0–0.1)
HCT: 44.6 % (ref 39.0–52.0)
Hemoglobin: 15.3 g/dL (ref 13.0–17.0)
Lymphs Abs: 1.8 10*3/uL (ref 0.7–4.0)
MCHC: 34.3 g/dL (ref 30.0–36.0)
Monocytes Relative: 6 % (ref 3.0–12.0)
Neutro Abs: 5.4 10*3/uL (ref 1.4–7.7)
RDW: 13 % (ref 11.5–14.6)

## 2012-07-24 LAB — BASIC METABOLIC PANEL
CO2: 27 mEq/L (ref 19–32)
GFR: 99.97 mL/min (ref >60.00)
Glucose, Bld: 262 mg/dL — ABNORMAL HIGH (ref 70–99)
Potassium: 3.7 mEq/L (ref 3.5–5.1)
Sodium: 135 mEq/L (ref 135–145)

## 2012-07-24 NOTE — Telephone Encounter (Signed)
I spoke with the pt and he called because he received a bottle of clopidogrel (Plavix) from the [place].  I made the pt aware that initially we were going to switch the pt from Brilinta over to Plavix due to cost issues.  The pt has not had any issues with purchasing Brilinta at this time.  I instructed the pt to remain on his current medication regimen and not take Plavix. The pt will put Plavix in his cabinet. Pt agreed with plan.

## 2012-07-24 NOTE — Telephone Encounter (Signed)
New problem:   Need to discuss new medication from [place].

## 2012-07-25 ENCOUNTER — Encounter (HOSPITAL_COMMUNITY)
Admission: RE | Disposition: A | Discharge status: Home / Self Care | Payer: Self-pay | Source: Ambulatory Visit | Attending: Cardiology

## 2012-07-25 ENCOUNTER — Ambulatory Visit (HOSPITAL_COMMUNITY): Payer: Medicare Other

## 2012-07-25 ENCOUNTER — Encounter (HOSPITAL_COMMUNITY): Payer: Self-pay | Admitting: Cardiology

## 2012-07-25 ENCOUNTER — Ambulatory Visit (HOSPITAL_COMMUNITY)
Admission: RE | Admit: 2012-07-25 | Discharge: 2012-07-26 | Disposition: A | Discharge status: Home / Self Care | Payer: Medicare Other | Source: Ambulatory Visit | Attending: Cardiology | Admitting: Cardiology

## 2012-07-25 DIAGNOSIS — T82897A Other specified complication of cardiac prosthetic devices, implants and grafts, initial encounter: Secondary | ICD-10-CM | POA: Insufficient documentation

## 2012-07-25 DIAGNOSIS — Z23 Encounter for immunization: Secondary | ICD-10-CM | POA: Insufficient documentation

## 2012-07-25 DIAGNOSIS — F200 Paranoid schizophrenia: Secondary | ICD-10-CM | POA: Insufficient documentation

## 2012-07-25 DIAGNOSIS — F4310 Post-traumatic stress disorder, unspecified: Secondary | ICD-10-CM | POA: Insufficient documentation

## 2012-07-25 DIAGNOSIS — F329 Major depressive disorder, single episode, unspecified: Secondary | ICD-10-CM

## 2012-07-25 DIAGNOSIS — Z9114 Patient's other noncompliance with medication regimen: Secondary | ICD-10-CM

## 2012-07-25 DIAGNOSIS — I10 Essential (primary) hypertension: Secondary | ICD-10-CM | POA: Insufficient documentation

## 2012-07-25 DIAGNOSIS — Z91199 Patient's noncompliance with other medical treatment and regimen due to unspecified reason: Secondary | ICD-10-CM | POA: Insufficient documentation

## 2012-07-25 DIAGNOSIS — Z9119 Patient's noncompliance with other medical treatment and regimen: Secondary | ICD-10-CM | POA: Insufficient documentation

## 2012-07-25 DIAGNOSIS — Z9861 Coronary angioplasty status: Secondary | ICD-10-CM | POA: Insufficient documentation

## 2012-07-25 DIAGNOSIS — I2510 Atherosclerotic heart disease of native coronary artery without angina pectoris: Secondary | ICD-10-CM

## 2012-07-25 DIAGNOSIS — Y831 Surgical operation with implant of artificial internal device as the cause of abnormal reaction of the patient, or of later complication, without mention of misadventure at the time of the procedure: Secondary | ICD-10-CM | POA: Insufficient documentation

## 2012-07-25 DIAGNOSIS — E119 Type 2 diabetes mellitus without complications: Secondary | ICD-10-CM | POA: Insufficient documentation

## 2012-07-25 DIAGNOSIS — E785 Hyperlipidemia, unspecified: Secondary | ICD-10-CM | POA: Insufficient documentation

## 2012-07-25 HISTORY — PX: LEFT HEART CATHETERIZATION WITH CORONARY ANGIOGRAM: SHX5451

## 2012-07-25 HISTORY — DX: Ulcer of esophagus without bleeding: K22.10

## 2012-07-25 HISTORY — DX: Depression, unspecified: F32.A

## 2012-07-25 HISTORY — DX: Angina pectoris, unspecified: I20.9

## 2012-07-25 HISTORY — DX: Adverse effect of unspecified anesthetic, initial encounter: T41.45XA

## 2012-07-25 HISTORY — DX: Contact with and (suspected) exposure to other hazardous, chiefly nonmedicinal, chemicals: Z77.098

## 2012-07-25 HISTORY — DX: Other complications of anesthesia, initial encounter: T88.59XA

## 2012-07-25 HISTORY — DX: Unspecified macular degeneration: H35.30

## 2012-07-25 HISTORY — DX: Respiratory tuberculosis unspecified: A15.9

## 2012-07-25 HISTORY — DX: Unspecified injury of unspecified kidney, initial encounter: S37.009A

## 2012-07-25 HISTORY — DX: Gastro-esophageal reflux disease without esophagitis: K21.9

## 2012-07-25 HISTORY — DX: Unspecified convulsions: R56.9

## 2012-07-25 HISTORY — DX: Type 2 diabetes mellitus with diabetic polyneuropathy: E11.42

## 2012-07-25 HISTORY — DX: Anxiety disorder, unspecified: F41.9

## 2012-07-25 HISTORY — DX: Major depressive disorder, single episode, unspecified: F32.9

## 2012-07-25 HISTORY — DX: Obstructive sleep apnea (adult) (pediatric): G47.33

## 2012-07-25 LAB — GLUCOSE, CAPILLARY: Glucose-Capillary: 233 mg/dL — ABNORMAL HIGH (ref 70–99)

## 2012-07-25 LAB — POCT ACTIVATED CLOTTING TIME: Activated Clotting Time: 494 seconds

## 2012-07-25 SURGERY — LEFT HEART CATHETERIZATION WITH CORONARY ANGIOGRAM
Anesthesia: LOCAL

## 2012-07-25 MED ORDER — NITROGLYCERIN 0.4 MG SL SUBL: 0.4000 mg | SUBLINGUAL_TABLET | SUBLINGUAL | Status: DC | PRN

## 2012-07-25 MED ORDER — SODIUM CHLORIDE 0.9 % IV SOLN: INTRAVENOUS | Status: AC

## 2012-07-25 MED ORDER — POLYVINYL ALCOHOL 1.4 % OP SOLN
1.0000 [drp] | Freq: Two times a day (BID) | OPHTHALMIC | Status: DC
  Administered 2012-07-25 – 2012-07-26 (×2): 1 [drp] via OPHTHALMIC
  Filled 2012-07-25: qty 15

## 2012-07-25 MED ORDER — MIDAZOLAM HCL 2 MG/2ML IJ SOLN
INTRAMUSCULAR | Status: AC
  Filled 2012-07-25: qty 2

## 2012-07-25 MED ORDER — ASPIRIN 81 MG PO CHEW
324.0000 mg | CHEWABLE_TABLET | ORAL | Status: AC
  Administered 2012-07-25: 324 mg via ORAL
  Filled 2012-07-25: qty 4

## 2012-07-25 MED ORDER — BIVALIRUDIN 250 MG IV SOLR
INTRAVENOUS | Status: AC
  Filled 2012-07-25: qty 250

## 2012-07-25 MED ORDER — MECLIZINE HCL 25 MG PO TABS
25.0000 mg | ORAL_TABLET | Freq: Three times a day (TID) | ORAL | Status: DC | PRN
  Filled 2012-07-25: qty 1

## 2012-07-25 MED ORDER — ATROPINE SULFATE 1 MG/ML IJ SOLN
INTRAMUSCULAR | Status: AC
  Filled 2012-07-25: qty 1

## 2012-07-25 MED ORDER — INSULIN REGULAR HUMAN 100 UNIT/ML IJ SOLN: 3.0000 [IU] | Freq: Three times a day (TID) | INTRAMUSCULAR | Status: DC

## 2012-07-25 MED ORDER — INSULIN NPH ISOPHANE & REGULAR (70-30) 100 UNIT/ML ~~LOC~~ SUSP: 15.0000 [IU] | Freq: Two times a day (BID) | SUBCUTANEOUS | Status: DC

## 2012-07-25 MED ORDER — HEPARIN SODIUM (PORCINE) 1000 UNIT/ML IJ SOLN
INTRAMUSCULAR | Status: AC
  Filled 2012-07-25: qty 1

## 2012-07-25 MED ORDER — NITROGLYCERIN 0.2 MG/ML ON CALL CATH LAB
INTRAVENOUS | Status: AC
  Filled 2012-07-25: qty 1

## 2012-07-25 MED ORDER — CHLORHEXIDINE GLUCONATE 0.12 % MT SOLN
15.0000 mL | Freq: Two times a day (BID) | OROMUCOSAL | Status: DC
  Administered 2012-07-25 – 2012-07-26 (×2): 15 mL via OROMUCOSAL
  Filled 2012-07-25 (×3): qty 15

## 2012-07-25 MED ORDER — CARBOXYMETHYLCELLULOSE SODIUM 1 % OP SOLN: 1.0000 [drp] | Freq: Two times a day (BID) | OPHTHALMIC | Status: DC

## 2012-07-25 MED ORDER — METOPROLOL TARTRATE 25 MG PO TABS
25.0000 mg | ORAL_TABLET | Freq: Two times a day (BID) | ORAL | Status: DC
  Filled 2012-07-25: qty 1

## 2012-07-25 MED ORDER — METOPROLOL TARTRATE 12.5 MG HALF TABLET
12.5000 mg | ORAL_TABLET | Freq: Two times a day (BID) | ORAL | Status: DC
  Administered 2012-07-26: 12.5 mg via ORAL
  Filled 2012-07-25 (×2): qty 1

## 2012-07-25 MED ORDER — INSULIN ASPART PROT & ASPART (70-30 MIX) 100 UNIT/ML ~~LOC~~ SUSP
20.0000 [IU] | Freq: Every day | SUBCUTANEOUS | Status: DC
  Administered 2012-07-25: 20 [IU] via SUBCUTANEOUS
  Filled 2012-07-25: qty 3

## 2012-07-25 MED ORDER — ASPIRIN 81 MG PO CHEW
81.0000 mg | CHEWABLE_TABLET | Freq: Every day | ORAL | Status: DC
  Administered 2012-07-26: 81 mg via ORAL
  Filled 2012-07-25: qty 1

## 2012-07-25 MED ORDER — HEPARIN (PORCINE) IN NACL 2-0.9 UNIT/ML-% IJ SOLN
INTRAMUSCULAR | Status: AC
  Filled 2012-07-25: qty 1000

## 2012-07-25 MED ORDER — SODIUM CHLORIDE 0.9 % IJ SOLN: 3.0000 mL | Freq: Two times a day (BID) | INTRAMUSCULAR | Status: DC

## 2012-07-25 MED ORDER — LIDOCAINE HCL (PF) 1 % IJ SOLN
INTRAMUSCULAR | Status: AC
  Filled 2012-07-25: qty 30

## 2012-07-25 MED ORDER — DORZOLAMIDE HCL 2 % OP SOLN
1.0000 [drp] | Freq: Every day | OPHTHALMIC | Status: DC
  Administered 2012-07-25: 1 [drp] via OPHTHALMIC
  Filled 2012-07-25: qty 10

## 2012-07-25 MED ORDER — SODIUM CHLORIDE 0.9 % IJ SOLN: 3.0000 mL | INTRAMUSCULAR | Status: DC | PRN

## 2012-07-25 MED ORDER — FENTANYL CITRATE 0.05 MG/ML IJ SOLN
INTRAMUSCULAR | Status: AC
  Filled 2012-07-25: qty 2

## 2012-07-25 MED ORDER — ACETAMINOPHEN 325 MG PO TABS: 650.0000 mg | ORAL_TABLET | ORAL | Status: DC | PRN

## 2012-07-25 MED ORDER — SODIUM CHLORIDE 0.9 % IV SOLN
INTRAVENOUS | Status: DC
  Administered 2012-07-25: 08:00:00 via INTRAVENOUS

## 2012-07-25 MED ORDER — TICAGRELOR 90 MG PO TABS
90.0000 mg | ORAL_TABLET | Freq: Two times a day (BID) | ORAL | Status: DC
  Administered 2012-07-25 – 2012-07-26 (×2): 90 mg via ORAL
  Filled 2012-07-25 (×3): qty 1

## 2012-07-25 MED ORDER — TERAZOSIN HCL 5 MG PO CAPS
5.0000 mg | ORAL_CAPSULE | Freq: Every day | ORAL | Status: DC
  Administered 2012-07-25: 5 mg via ORAL
  Filled 2012-07-25 (×2): qty 1

## 2012-07-25 MED ORDER — LATANOPROST 0.005 % OP SOLN
1.0000 [drp] | Freq: Every day | OPHTHALMIC | Status: DC
  Administered 2012-07-25: 1 [drp] via OPHTHALMIC
  Filled 2012-07-25: qty 2.5

## 2012-07-25 MED ORDER — ONDANSETRON HCL 4 MG/2ML IJ SOLN: 4.0000 mg | Freq: Four times a day (QID) | INTRAMUSCULAR | Status: DC | PRN

## 2012-07-25 MED ORDER — INSULIN ASPART PROT & ASPART (70-30 MIX) 100 UNIT/ML ~~LOC~~ SUSP
15.0000 [IU] | Freq: Every day | SUBCUTANEOUS | Status: DC
  Administered 2012-07-26: 15 [IU] via SUBCUTANEOUS
  Filled 2012-07-25: qty 3

## 2012-07-25 MED ORDER — SODIUM CHLORIDE 0.9 % IV SOLN: 250.0000 mL | INTRAVENOUS | Status: DC | PRN

## 2012-07-25 MED ORDER — ADENOSINE 12 MG/4ML IV SOLN
16.0000 mL | Freq: Once | INTRAVENOUS | Status: DC
  Filled 2012-07-25: qty 16

## 2012-07-25 MED ORDER — VERAPAMIL HCL 2.5 MG/ML IV SOLN
INTRAVENOUS | Status: AC
  Filled 2012-07-25: qty 2

## 2012-07-25 NOTE — Interval H&P Note (Signed)
History and Physical Interval Note:  07/25/2012 9:31 AM  Bruce [name]  has presented today for surgery, with the diagnosis of Chest pain  The various methods of treatment have been discussed with the patient. After consideration of risks, benefits and other options for treatment, the patient has consented to  Procedure(s) (LRB) with comments: LEFT HEART CATHETERIZATION WITH CORONARY ANGIOGRAM (N/A) as a surgical intervention .  The patient's history has been reviewed, patient examined, no change in status, stable for surgery.  I have reviewed the patient's chart and labs.  Questions were answered to the patient's satisfaction.     [name]
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
## 2012-07-25 NOTE — Progress Notes (Signed)
Pts heart rate running in the low 40s,a asymptomatic and blood pressure stable. On frequent VS post cath. Bruce Money, PA notified of change. Pt was in the 60s per cath lab report. Will cont to monitor and hold meds as appropriate

## 2012-07-25 NOTE — Progress Notes (Addendum)
Called to see patient regarding bradycardia, transient dipping of HR in the upper 30's and 40's. Nursing reported that patient was less talkative & more sleepy during that time. Upon our assessment, patient is alert, interactive, responding to questions appropriately (was slightly off with date - October) HR now sustaining 50s-60s and no acute focal deficit. CBG 231, due for insulin soon. His only current complaint is intermittent midline abd pain that has been present for several months without significant change. Exam benign, HR RRR, lungs CTAB, NABS, no  LEE. EKG 56bpm without acute changes. No nausea or vomiting. Discussed with Dr. Riley Kill who also came to see the patient - will hold off on metoprolol tonight and resume tomorrow at 12.5mg  BID. Cont to monitor pt. Dayna Dunn PA-C

## 2012-07-25 NOTE — Progress Notes (Signed)
Agree with note of Bruce [name].  Patient is currently stable.  He is alert and talkative.  No specific complaints.  Continue medical therapy.

## 2012-07-25 NOTE — CV Procedure (Signed)
   Cardiac Catheterization Procedure Note  Name: EDILBERTO ROOSEVELT MRN: [id number] DOB: 03/06/1946  Procedure: Left Heart Cath, Selective Coronary Angiography, LV angiography, FFR of the anomalous CFX artery  Indication:  Prior MI, with stent, then stent thrombosis due to voluntary DAPT dc.  Procedural Details:  The right wrist was prepped, draped, and anesthetized with 1% lidocaine. Using the modified Seldinger technique, a 5 French sheath was introduced into the right radial artery. 3 mg of verapamil was administered through the sheath, weight-based unfractionated heparin was administered intravenously. Standard Judkins catheters were used for selective coronary angiography and left ventriculography. Catheter exchanges were performed over an exchange length guidewire.  We then upgraded to a 32F sheath, and FFR was performed using IV adenosine.  Bivalirudin was given and ACT was appropriate.  The FFR was 0.88-0.91 and no PCI performed.  A TR band was placed without complication.   PROCEDURAL FINDINGS Hemodynamics: AO 115/57 (80) LV 112/10  Prelim:  RCA stent patent, 80% anomalous CFX, FFR 0.90 across lesion, no PCI done.  Medical therapy   Coronary angiography: Coronary dominance: right  Left mainstem: Has 20% mid narrowing. It provides a ramus and LAD, with a marginal arising from the RCA ostium consistent with an anomalous origin.     Left anterior descending (LAD): The LAD proper is a calcified diabetic appearing vessel with modest irregularity throughout, but no critical disease.  Notably, there is a tiny first diagonal with 70-80% narrowing, but it is tiny.  The second diagonal has about 30% ostial narrowing.  No critical disease is noted.   Left circumflex (LCx): There is a large ramus with 30%-40% mid narrowing.    Right coronary artery (RCA): The RCA supplies the inferior wall, and an OM arises proximally.  The prior stent in the RCA is widely patent. There is no narrowing.  The  distal vessel has mild irregularity supplying three smaller branches which have a pruned appearance without critical stenosis.  The anomalous CFX has a segmental 70-80% narrowing in the mid vessel.  FFR of this vessel was 0.88-0.91.    Left ventriculography: Left ventricular systolic function is normal, LVEF is estimated at 55-65%, there is no significant mitral regurgitation    Final Conclusions: 1.  Continued patency of the RCA site at the location of MI times two 2.  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  Preserved LV function with minimal if any inferior hypokinesis.     Recommendations:  1.  Continue meds. 2.  Encourage compliance.  3.  Patient has multiple issues including schizophrenia, depression;  We will continue to work with him to the best of our ability.    [name] 07/25/2012, 11:20 AM

## 2012-07-26 ENCOUNTER — Encounter (HOSPITAL_COMMUNITY): Payer: Self-pay | Admitting: Physician Assistant

## 2012-07-26 DIAGNOSIS — I2510 Atherosclerotic heart disease of native coronary artery without angina pectoris: Secondary | ICD-10-CM

## 2012-07-26 DIAGNOSIS — F329 Major depressive disorder, single episode, unspecified: Secondary | ICD-10-CM

## 2012-07-26 LAB — CBC
Platelets: 117 10*3/uL — ABNORMAL LOW (ref 150–400)
RDW: 12.7 % (ref 11.5–15.5)
WBC: 7.2 10*3/uL (ref 4.0–10.5)

## 2012-07-26 LAB — GLUCOSE, CAPILLARY: Glucose-Capillary: 186 mg/dL — ABNORMAL HIGH (ref 70–99)

## 2012-07-26 LAB — BASIC METABOLIC PANEL
Chloride: 101 mEq/L (ref 96–112)
GFR calc Af Amer: >90 mL/min (ref >90)
Potassium: 3.7 mEq/L (ref 3.5–5.1)
Sodium: 138 mEq/L (ref 135–145)

## 2012-07-26 MED ORDER — ASPIRIN 81 MG PO CHEW: 81.0000 mg | CHEWABLE_TABLET | Freq: Every day | ORAL | Status: DC

## 2012-07-26 MED ORDER — PNEUMOCOCCAL VAC POLYVALENT 25 MCG/0.5ML IJ INJ
0.5000 mL | INJECTION | Freq: Once | INTRAMUSCULAR | Status: AC
  Administered 2012-07-26: 0.5 mL via INTRAMUSCULAR
  Filled 2012-07-26: qty 0.5

## 2012-07-26 MED ORDER — INFLUENZA VIRUS VACC SPLIT PF IM SUSP
0.5000 mL | Freq: Once | INTRAMUSCULAR | Status: AC
  Administered 2012-07-26: 0.5 mL via INTRAMUSCULAR
  Filled 2012-07-26: qty 0.5

## 2012-07-26 MED ORDER — METOPROLOL TARTRATE 25 MG PO TABS: 12.5000 mg | ORAL_TABLET | Freq: Two times a day (BID) | ORAL | Status: DC

## 2012-07-26 NOTE — Discharge Summary (Signed)
Discharge Summary   Patient ID: Bruce [name],  MRN: [id number], DOB/AGE: December 13, 1945 66 y.o.  Admit date: 07/25/2012 Discharge date: 07/26/2012  Primary Physician: No primary provider on file. Primary Cardiologist: Bonnee Quin, MD  Discharge Diagnoses Principal Problem:  *CAD (coronary artery disease) Active Problems:  Schizophrenia  Hypertension  Noncompliance with medications  Type 2 diabetes mellitus  Hyperlipidemia  Depression  Allergies Allergies  Allergen Reactions  . Codeine Other (See Comments)    "causes me to pass out; regain consciousness; pass out, regain consciousness; like a ping pong - back and forth"  . 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 Medical therapy  Coronary angiography:  Coronary dominance: right  Left mainstem: Has 20% mid narrowing. It provides a ramus and LAD, with a marginal arising from the RCA ostium consistent with an anomalous origin.  Left anterior descending (LAD): The LAD proper is a calcified diabetic appearing vessel with modest irregularity throughout, but no critical disease. Notably, there is a tiny first diagonal with 70-80% narrowing, but it is tiny. The second diagonal has about 30% ostial narrowing. No critical disease is noted.  Left circumflex (LCx): There is a large ramus with 30%-40% mid narrowing.  Right coronary artery (RCA): The RCA supplies the inferior wall, and an OM arises proximally. The prior stent in the RCA is widely patent. There is no narrowing. The distal vessel has mild irregularity supplying three smaller branches which have a pruned appearance without critical stenosis. The anomalous CFX has a segmental 70-80% narrowing in the  mid vessel. FFR of this vessel was 0.88-0.91.  Left ventriculography: Left ventricular systolic function is normal, LVEF is estimated at 55-65%, there is no significant mitral regurgitation  Final Conclusions:  1. Continued patency of the RCA site at the location of MI times two  2. 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 Preserved LV function with minimal if any inferior hypokinesis.   History of Present Illness  Mr. Collington is a 67yo male who underwent outpatient cardiac cath on 07/25/12 for the above complaints. He has known CAD and presented with an acute inferior MI in 02/2012. He had a overlapping DES placed to his prox RCA and otherwise diffuse nonobstructive disease. He was started on ASA/Brilinta, however stopped taking these and presented back the following month with an acute inferior MI localized to the prox RCA. He underwent thrombectomy and balloon angioplasty. Compliance has been a challenge given his underlying schizophrenia/depression. He followed up with Dr. Riley Kill on 11/06 c/o stable exertional chest pain. Given his CAD and noncompliance history, Dr. Riley Kill found it necessary to perform a relook cath to evaluate patency of prior RCA and status of other underlying lesions.   Hospital Course   He presented on 07/25/12 for this. He was informed, consented and prepped for cath. As above, this revealed continued RCA patency at prior MI site x 2. 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 FFR across this area was 0.88-0.91. No intervention was performed. LVEF 55-65%. Diffuse nonobstructive dz was noted elsewhere. He tolerated the procedure well, without complications. The recommendation was made to continue meds, encourage compliance making every effort to work with him given underlying schizophrenia and depression. Later that afternoon, he did develop  bradycardia, HR 30-40s and was asymptomatic. Metoprolol was held that evening, and resumed the following morning. He remained  asymptomatic overnight and HR improved. He was assessed by Dr. Ladona Ridgel and deemed stable for discharge. He will resume all outpatient medications. Medication compliance was stressed. He will follow-up with Dr. Riley Kill in 3-4 weeks. This information, including post-cath instructions, has been clearly outlined in the discharge AVS.   Discharge Vitals:  Blood pressure 128/80, pulse 67, temperature 97.7 F (36.5 C), temperature source Oral, resp. rate 18, height 5\' 4"  (1.626 m), weight 79.833 kg (176 lb), SpO2 98.00%.   Labs: Recent Labs  Basename 07/26/12 0700 07/23/12 1658   WBC 7.2 8.1   HGB 14.2 15.3   HCT 39.8 44.6   MCV 89.2 93.6   PLT 117* 157.0    Lab 07/26/12 0700 07/23/12 1658  NA 138 135  K 3.7 3.7  CL 101 101  CO2 26 27  BUN 7 13  CREATININE 0.75 0.8  CALCIUM 9.5 9.7  PROT -- --  BILITOT -- --  ALKPHOS -- --  ALT -- --  AST -- --  AMYLASE -- --  LIPASE -- --  GLUCOSE 281* 262*   Disposition:   Follow-up Information    Follow up with [facility]. (Will be called with appointment date and time for follow-up after your cardiac catheterization. )    Contact information:   [address] [place] [address]         Discharge Medications:    Medication List     As of 07/26/2012  9:14 AM    START taking these medications         aspirin 81 MG chewable tablet   Chew 1 tablet (81 mg total) by mouth daily.      CHANGE how you take these medications         metoprolol tartrate 25 MG tablet   Commonly known as: LOPRESSOR   Take 0.5 tablets (12.5 mg total) by mouth 2 (two) times daily.   What changed: dose      CONTINUE taking these medications         carboxymethylcellulose 1 % ophthalmic solution      chlorhexidine 0.12 % solution   Commonly known as: PERIDEX      dorzolamide 2 % ophthalmic solution   Commonly known as: TRUSOPT      insulin NPH-insulin regular (70-30) 100 UNIT/ML injection   Commonly known as: NOVOLIN 70/30    Inject 15-20 Units into the skin 2 (two) times daily. 15 units in the morning  20 units in the evening      insulin regular 100 units/mL injection   Commonly known as: NOVOLIN R,HUMULIN R      latanoprost 0.005 % ophthalmic solution   Commonly known as: XALATAN      meclizine 25 MG tablet   Commonly known as: ANTIVERT   Take 1 tablet (25 mg total) by mouth 3 (three) times daily as needed for dizziness.      nitroGLYCERIN 0.4 MG SL tablet   Commonly known as: NITROSTAT   Place 1 tablet (0.4 mg total) under the tongue every 5 (five) minutes as needed for chest pain (up to 3 doses).      terazosin 5 MG capsule   Commonly known as: HYTRIN      Ticagrelor 90 MG Tabs tablet   Commonly known as: BRILINTA   Take 1 tablet (90 mg total) by mouth 2 (two) times  daily.          Where to get your medications    These are the prescriptions that you need to pick up. We sent them to a specific pharmacy, so you will need to go there to get them.   [id number] - [place], [place] - [address]    [address] [place] [address]    Phone: [phone]        metoprolol tartrate 25 MG tablet         Information on where to get these meds is not yet available. Ask your nurse or doctor.         aspirin 81 MG chewable tablet           Outstanding Labs/Studies: None  Duration of Discharge Encounter: Greater than 30 minutes including physician time.  Signed, R. Hurman Horn, PA-C 07/26/2012, 9:14 AM

## 2012-07-26 NOTE — Progress Notes (Addendum)
CARDIAC REHAB PHASE I   PRE:  Rate/Rhythm: SR 61  BP:  Supine:   Sitting: 138/70  Standing:    SaO2: 99 ra  MODE:  Ambulation: 300 ft With cane and 4 standing rest breaks  POST:  Rate/Rhythem: 68  BP:  Supine:   Sitting: 155/74  Standing:    SaO2: 100 ra Pt ambulated in hallway with cane and had some balance difficulty.  Pt reported that he has a walker at home.  Advised pt to use walker for support until he felt able to use a cane safely.  Pt agreed and verbalized understanding.  Pt back to bed.  Risk Factor modification education reviewed with pt.  Pt agreeable and understood heart healthy diet with diabetic modifications, exercise guidelines, Ntg protocol and alert 911 for any unresolved chest pain, discussed at great length the importance of compliance of his medications. Pt stated that he felt depressed and overwhelmed and that is why he didn't take his medications.  Pt feels he is in a "better place" and plans to make positive changes. Pt given handouts, questions answered.  Pt agreed to have contact information sent to [facility].  Pt had previous referral in June and again in July after previous events but declined due to transportation issues.  Pt plans to use [facility] for his transportation needs and feels that will no longer be a barrier. Pt back to his bed, call bell in place, no complaints of cp or sob.  [id number] Arna Medici

## 2012-07-26 NOTE — Progress Notes (Signed)
Nutrition Brief Note  Patient identified on the Malnutrition Screening Tool (MST) Report. Patient's weight has been stable per readings below:  Wt Readings from Last 10 Encounters:  07/25/12 176 lb (79.833 kg)  07/25/12 176 lb (79.833 kg)  07/23/12 175 lb (79.379 kg)  07/21/12 176 lb (79.833 kg)  06/04/12 177 lb 1.9 oz (80.341 kg)  05/07/12 178 lb 1.9 oz (80.795 kg)  04/09/12 172 lb (78.019 kg)  03/20/12 172 lb 6.4 oz (78.2 kg)  03/20/12 172 lb 6.4 oz (78.2 kg)  03/03/12 175 lb 7.8 oz (79.6 kg)    Body mass index is 30.21 kg/(m^2). Pt meets criteria for Obesity Class I based on current BMI.   Current diet order is Carbohydrate Modified Medium Calorie, patient is consuming approximately 100% of meals at this time. Labs and medications reviewed.   No nutrition interventions warranted at this time. Plan is for discharge today. If nutrition issues arise, please consult RD.   Kirkland Hun, RD, LDN Pager #: [phone] After-Hours Pager #: [phone]

## 2012-07-26 NOTE — Progress Notes (Signed)
Patient ID: Bruce [name], male   DOB: [birth year], 66 y.o.   MRN: [id number] Subjective:  S/p left heart cath doing well. No chest pain.  Objective:  Vital Signs in the last 24 hours: Temp:  [97.6 F (36.4 C)-98.2 F (36.8 C)] 97.7 F (36.5 C) (11/09 0700) Pulse Rate:  [52-67] 67  (11/09 0700) Resp:  [18-20] 18  (11/09 0700) BP: (101-164)/(53-90) 128/80 mmHg (11/09 0700) SpO2:  [96 %-100 %] 98 % (11/09 0700)  Intake/Output from previous day: 11/08 0701 - 11/09 0700 In: -  Out: 500 [Urine:500] Intake/Output from this shift:    Physical Exam: Well appearing NAD HEENT: Unremarkable Neck:  No JVD, no thyromegally Lungs:  Clear HEART:  Regular rate rhythm, no murmurs, no rubs, no clicks Abd:  Flat, positive bowel sounds, no organomegally, no rebound, no guarding Ext:  2 plus pulses, no edema, no cyanosis, no clubbing, right wrist without hematoma and hand warm. Skin:  No rashes no nodules Neuro:  CN II through XII intact, motor grossly intact  Lab Results:  Basename 07/26/12 0700 07/23/12 1658  WBC 7.2 8.1  HGB 14.2 15.3  PLT 117* 157.0    Basename 07/23/12 1658  NA 135  K 3.7  CL 101  CO2 27  GLUCOSE 262*  BUN 13  CREATININE 0.8   No results found for this basename: TROPONINI:2,CK,MB:2 in the last 72 hours Hepatic Function Panel No results found for this basename: PROT,ALBUMIN,AST,ALT,ALKPHOS,BILITOT,BILIDIR,IBILI in the last 72 hours No results found for this basename: CHOL in the last 72 hours No results found for this basename: PROTIME in the last 72 hours  Imaging: No results found.  Cardiac Studies: Tele - nsr Assessment/Plan:  1. CAD 2. HTN 3. Dyslipidemia Rec: ok for discharge on current meds/home meds. Followup Dr. Zara Chess in 3-4 weeks.  LOS: 1 day    Arty Lantzy,M.D. 07/26/2012, 8:18 AM

## 2012-07-28 MED FILL — Dextrose Inj 5%: INTRAVENOUS | Qty: 50 | Status: AC

## 2012-08-07 ENCOUNTER — Ambulatory Visit (HOSPITAL_COMMUNITY): Payer: Medicare Other

## 2012-08-11 ENCOUNTER — Ambulatory Visit (HOSPITAL_COMMUNITY): Payer: Medicare Other

## 2012-08-12 ENCOUNTER — Ambulatory Visit: Payer: Medicare Other | Admitting: Cardiology

## 2012-08-13 ENCOUNTER — Ambulatory Visit (HOSPITAL_COMMUNITY): Payer: Medicare Other

## 2012-08-15 ENCOUNTER — Ambulatory Visit (HOSPITAL_COMMUNITY): Payer: Medicare Other

## 2012-08-17 ENCOUNTER — Emergency Department (HOSPITAL_COMMUNITY)
Admission: EM | Admit: 2012-08-17 | Discharge: 2012-08-17 | Disposition: A | Discharge status: Home / Self Care | Payer: Medicare Other | Attending: Emergency Medicine | Admitting: Emergency Medicine

## 2012-08-17 DIAGNOSIS — R739 Hyperglycemia, unspecified: Secondary | ICD-10-CM

## 2012-08-17 DIAGNOSIS — Z8669 Personal history of other diseases of the nervous system and sense organs: Secondary | ICD-10-CM | POA: Insufficient documentation

## 2012-08-17 DIAGNOSIS — Z7982 Long term (current) use of aspirin: Secondary | ICD-10-CM | POA: Insufficient documentation

## 2012-08-17 DIAGNOSIS — Z87448 Personal history of other diseases of urinary system: Secondary | ICD-10-CM | POA: Insufficient documentation

## 2012-08-17 DIAGNOSIS — I252 Old myocardial infarction: Secondary | ICD-10-CM | POA: Insufficient documentation

## 2012-08-17 DIAGNOSIS — E1169 Type 2 diabetes mellitus with other specified complication: Secondary | ICD-10-CM | POA: Insufficient documentation

## 2012-08-17 DIAGNOSIS — N400 Enlarged prostate without lower urinary tract symptoms: Secondary | ICD-10-CM | POA: Insufficient documentation

## 2012-08-17 DIAGNOSIS — G4733 Obstructive sleep apnea (adult) (pediatric): Secondary | ICD-10-CM | POA: Insufficient documentation

## 2012-08-17 DIAGNOSIS — M791 Myalgia, unspecified site: Secondary | ICD-10-CM

## 2012-08-17 DIAGNOSIS — Z8719 Personal history of other diseases of the digestive system: Secondary | ICD-10-CM | POA: Insufficient documentation

## 2012-08-17 DIAGNOSIS — Z8659 Personal history of other mental and behavioral disorders: Secondary | ICD-10-CM | POA: Insufficient documentation

## 2012-08-17 DIAGNOSIS — E785 Hyperlipidemia, unspecified: Secondary | ICD-10-CM | POA: Insufficient documentation

## 2012-08-17 DIAGNOSIS — Z8611 Personal history of tuberculosis: Secondary | ICD-10-CM | POA: Insufficient documentation

## 2012-08-17 DIAGNOSIS — G909 Disorder of the autonomic nervous system, unspecified: Secondary | ICD-10-CM | POA: Insufficient documentation

## 2012-08-17 DIAGNOSIS — R110 Nausea: Secondary | ICD-10-CM | POA: Insufficient documentation

## 2012-08-17 DIAGNOSIS — I2510 Atherosclerotic heart disease of native coronary artery without angina pectoris: Secondary | ICD-10-CM | POA: Insufficient documentation

## 2012-08-17 DIAGNOSIS — R5381 Other malaise: Secondary | ICD-10-CM | POA: Insufficient documentation

## 2012-08-17 DIAGNOSIS — Z79899 Other long term (current) drug therapy: Secondary | ICD-10-CM | POA: Insufficient documentation

## 2012-08-17 DIAGNOSIS — Z794 Long term (current) use of insulin: Secondary | ICD-10-CM | POA: Insufficient documentation

## 2012-08-17 DIAGNOSIS — IMO0001 Reserved for inherently not codable concepts without codable children: Secondary | ICD-10-CM | POA: Insufficient documentation

## 2012-08-17 DIAGNOSIS — Z8679 Personal history of other diseases of the circulatory system: Secondary | ICD-10-CM | POA: Insufficient documentation

## 2012-08-17 DIAGNOSIS — I10 Essential (primary) hypertension: Secondary | ICD-10-CM | POA: Insufficient documentation

## 2012-08-17 DIAGNOSIS — E1149 Type 2 diabetes mellitus with other diabetic neurological complication: Secondary | ICD-10-CM | POA: Insufficient documentation

## 2012-08-17 DIAGNOSIS — Z87828 Personal history of other (healed) physical injury and trauma: Secondary | ICD-10-CM | POA: Insufficient documentation

## 2012-08-17 LAB — URINALYSIS, ROUTINE W REFLEX MICROSCOPIC
Hgb urine dipstick: NEGATIVE
Specific Gravity, Urine: 1.035 — ABNORMAL HIGH (ref 1.005–1.030)
Urobilinogen, UA: 1 mg/dL (ref 0.0–1.0)

## 2012-08-17 LAB — CBC WITH DIFFERENTIAL/PLATELET
Basophils Relative: 0 % (ref 0–1)
Eosinophils Absolute: 0.4 10*3/uL (ref 0.0–0.7)
Eosinophils Relative: 4 % (ref 0–5)
Hemoglobin: 15.2 g/dL (ref 13.0–17.0)
Lymphocytes Relative: 18 % (ref 12–46)
Monocytes Relative: 6 % (ref 3–12)
Neutrophils Relative %: 72 % (ref 43–77)
RBC: 4.53 MIL/uL (ref 4.22–5.81)
WBC: 9 10*3/uL (ref 4.0–10.5)

## 2012-08-17 LAB — URINE MICROSCOPIC-ADD ON: Urine-Other: NONE SEEN

## 2012-08-17 LAB — BASIC METABOLIC PANEL
CO2: 25 mEq/L (ref 19–32)
Chloride: 96 mEq/L (ref 96–112)
GFR calc non Af Amer: >90 mL/min (ref >90)
Glucose, Bld: 355 mg/dL — ABNORMAL HIGH (ref 70–99)
Potassium: 4.2 mEq/L (ref 3.5–5.1)
Sodium: 133 mEq/L — ABNORMAL LOW (ref 135–145)

## 2012-08-17 LAB — CK TOTAL AND CKMB (NOT AT ARMC)
CK, MB: 4 ng/mL (ref 0.3–4.0)
Total CK: 128 U/L (ref 7–232)

## 2012-08-17 LAB — GLUCOSE, CAPILLARY: Glucose-Capillary: 278 mg/dL — ABNORMAL HIGH (ref 70–99)

## 2012-08-17 MED ORDER — INSULIN ASPART 100 UNIT/ML ~~LOC~~ SOLN
10.0000 [IU] | Freq: Once | SUBCUTANEOUS | Status: AC
  Administered 2012-08-17: 10 [IU] via SUBCUTANEOUS
  Filled 2012-08-17: qty 1

## 2012-08-17 MED ORDER — GABAPENTIN 600 MG PO TABS: 600.0000 mg | ORAL_TABLET | Freq: Three times a day (TID) | ORAL | Status: DC

## 2012-08-17 MED ORDER — GABAPENTIN 300 MG PO CAPS
600.0000 mg | ORAL_CAPSULE | Freq: Once | ORAL | Status: AC
  Administered 2012-08-17: 600 mg via ORAL
  Filled 2012-08-17: qty 2

## 2012-08-17 MED ORDER — GABAPENTIN 600 MG PO TABS: 600.0000 mg | ORAL_TABLET | Freq: Once | ORAL | Status: DC

## 2012-08-17 MED ORDER — SODIUM CHLORIDE 0.9 % IV BOLUS (SEPSIS)
1000.0000 mL | Freq: Once | INTRAVENOUS | Status: AC
  Administered 2012-08-17: 06:00:00 via INTRAVENOUS

## 2012-08-17 MED ORDER — FENTANYL CITRATE 0.05 MG/ML IJ SOLN
50.0000 ug | Freq: Once | INTRAMUSCULAR | Status: AC
  Administered 2012-08-17: 50 ug via INTRAVENOUS
  Filled 2012-08-17: qty 2

## 2012-08-17 NOTE — ED Notes (Signed)
Pt present to ED via EMS from home with c/o generalized pain.  Pt reports that pain worsened today.  Pt reports hx chronic pain.  Pt aaox3.  Vitals stable 130/82 HR 90 97 RA 14 resp cbg 262.  12 lead EKG per EMS is NSR.  Pt aaox3.  Pt denies chills/fever.  Pt reports n/v for couple days.

## 2012-08-17 NOTE — ED Notes (Signed)
Patient is sleep and unable to urinate.

## 2012-08-17 NOTE — ED Notes (Signed)
[id number]<AV> Expected date:<BR> Expected time:<BR> Means of arrival:<BR> Comments:<BR> EMS Male Gen Pain
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
## 2012-08-18 ENCOUNTER — Ambulatory Visit (HOSPITAL_COMMUNITY): Payer: Medicare Other

## 2012-08-20 ENCOUNTER — Ambulatory Visit (HOSPITAL_COMMUNITY): Payer: Medicare Other

## 2012-08-20 ENCOUNTER — Encounter: Payer: Self-pay | Admitting: Cardiology

## 2012-08-20 ENCOUNTER — Ambulatory Visit (INDEPENDENT_AMBULATORY_CARE_PROVIDER_SITE_OTHER): Payer: Medicare Other | Admitting: Cardiology

## 2012-08-20 VITALS — BP 120/74 | HR 73 | Ht 64.0 in | Wt 181.0 lb

## 2012-08-20 DIAGNOSIS — E119 Type 2 diabetes mellitus without complications: Secondary | ICD-10-CM

## 2012-08-20 DIAGNOSIS — I2510 Atherosclerotic heart disease of native coronary artery without angina pectoris: Secondary | ICD-10-CM

## 2012-08-20 NOTE — Assessment & Plan Note (Signed)
The patient underwent repeat cardiac catheterization. His stents were widely patent. 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 Fractional flow reserve was performed, and was negative for flow limiting lesion. Therefore, he is been managed conservatively. He seems to be doing better from a clinical standpoint, has been maintained on medical therapy. We will plan to see him back in followup in 2 months time. He apparently is scheduled to have an stress test at the [place],  although it is not quite clear to me based on the [facility] data with this we'll accomplish.

## 2012-08-20 NOTE — Assessment & Plan Note (Signed)
His diabetic management has been terrible. He is scheduled to get his eye checked tomorrow tender in, and I strongly encouraged him to go ahead and get his general medical appointment down there.  His triglycerides are elevated, and her sugars trend very much towards the high side. He told that he had sugars in the 300-500 range yesterday.  His insulin doses were cut back when he was in the hospital. He subsequently went down to the general medical clinic at the [place] in [place] and no adjustments were made at that time. He does have a history of multiple insulin overdoses which were purposeful. As such, we will adjust each of his long-acting insulin doses up 5 units and he is to keep and I and monitor these values. He is taking regular around-the-clock. Should they remain elevated, his ear the contact us, or contact the [place] in [place] where he gets his general medical care. They have followed him for quite some time. He does say he has trouble getting them on the phone

## 2012-08-20 NOTE — Patient Instructions (Addendum)
Your physician recommends that you schedule a follow-up appointment in: 4 WEEKS with Dr Riley Kill  Your physician has recommended you make the following change in your medication: INCREASE your NPH Insulin to 20 Units in the morning and 25 Units in the evening  Please call the office on Friday 08/22/12 with your glucose readings [phone]).   [facility] and [facility] with [facility] physician.
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
## 2012-08-21 ENCOUNTER — Inpatient Hospital Stay (HOSPITAL_COMMUNITY): Admission: RE | Admit: 2012-08-21 | Payer: Medicare Other | Source: Ambulatory Visit

## 2012-08-22 ENCOUNTER — Ambulatory Visit (HOSPITAL_COMMUNITY): Payer: Medicare Other

## 2012-08-22 ENCOUNTER — Telehealth: Payer: Self-pay | Admitting: Cardiology

## 2012-08-22 NOTE — Telephone Encounter (Signed)
New problem:    Patient was told to call back with information.  PCP appt is 12/19 @ 11 am.  Still waiting on  Stress test appt from the [facility] hospital in [place].

## 2012-08-22 NOTE — Telephone Encounter (Signed)
I spoke with the pt and his glucose last night was 364, this morning 301.  The pt also called to clarify if he should be taking Plavix.  I made the pt aware that we do not want him taking Plavix because he is currently taking Brilinta. Initially there was an issue with the cost of Brilinta and Dr Riley Kill was going to start Plavix.  The pt can get Brilinta at this time and I instructed him to remain on this medication.  The pt will put Plavix in his cabinet in case he needs it in the future.

## 2012-08-22 NOTE — Telephone Encounter (Signed)
Per Dr Riley Kill increase insulin to 24 Units AM and 29 Units PM.

## 2012-08-22 NOTE — Telephone Encounter (Signed)
Pt calling re taking plavix , is ok to take with everything else he is taking? pls call [phone]

## 2012-08-22 NOTE — Telephone Encounter (Signed)
Pt aware of Dr Rosalyn Charters instructions.  The pt will call the office on Monday with his glucose readings.

## 2012-08-25 ENCOUNTER — Encounter: Payer: Self-pay | Admitting: Cardiology

## 2012-08-25 ENCOUNTER — Ambulatory Visit (HOSPITAL_COMMUNITY): Payer: Medicare Other

## 2012-08-25 NOTE — Telephone Encounter (Signed)
Pt calling with update from info last week, pls call

## 2012-08-25 NOTE — Telephone Encounter (Signed)
This encounter was created in error - please disregard.

## 2012-08-25 NOTE — Telephone Encounter (Signed)
I spoke with the pt and he was unable to check his glucose levels as directed over the weekend because he slept almost all weekend.  The pt is taking Neurontin and it is making him very groggy. When the pt checked his glucose on Sunday it was 192.  The pt will continue with current Insulin dosage.  The pt has contacted the prescribing physicians office for further instructions about Neurontin.

## 2012-08-27 ENCOUNTER — Ambulatory Visit (HOSPITAL_COMMUNITY): Payer: Medicare Other

## 2012-08-29 ENCOUNTER — Ambulatory Visit (HOSPITAL_COMMUNITY): Payer: Medicare Other

## 2012-09-01 ENCOUNTER — Ambulatory Visit (HOSPITAL_COMMUNITY): Payer: Medicare Other

## 2012-09-02 ENCOUNTER — Telehealth: Payer: Self-pay | Admitting: Cardiology

## 2012-09-02 NOTE — Telephone Encounter (Signed)
Follow-up:    Patient returned your call.  Please call back. 

## 2012-09-02 NOTE — Telephone Encounter (Signed)
Left message for pt to call back  °

## 2012-09-02 NOTE — Telephone Encounter (Signed)
Pt calling re  results of stress test done at the [facility] in [place], wants to give you the results,  pls call

## 2012-09-02 NOTE — Telephone Encounter (Signed)
I spoke with the pt and he had a stress test performed yesterday at the [facility].  Today the pt feels wiped out.  The pt wanted to make [place] aware that they had to hold him on the treadmill and kept slowing down the treadmill.  The pt did make them aware the he walks with a cane. During the pt's treadmill he had difficulty seeing a picture on the wall and could not focus and the test was stopped.  The pt did get a call from the [facility] this morning about the results of his GXT.  Per pt "there were no substantial abnormalities" per the [facility]. The pt's glucose during test was 210.  The pt will try to obtain a copy of results for our record.

## 2012-09-03 ENCOUNTER — Ambulatory Visit (HOSPITAL_COMMUNITY): Payer: Medicare Other

## 2012-09-05 ENCOUNTER — Ambulatory Visit (HOSPITAL_COMMUNITY): Payer: Medicare Other

## 2012-09-08 ENCOUNTER — Ambulatory Visit (HOSPITAL_COMMUNITY): Payer: Medicare Other

## 2012-09-12 ENCOUNTER — Ambulatory Visit (HOSPITAL_COMMUNITY): Payer: Medicare Other

## 2012-09-15 ENCOUNTER — Ambulatory Visit (HOSPITAL_COMMUNITY): Payer: Medicare Other

## 2012-09-17 ENCOUNTER — Ambulatory Visit (HOSPITAL_COMMUNITY): Payer: Medicare Other

## 2012-09-19 ENCOUNTER — Ambulatory Visit (HOSPITAL_COMMUNITY): Payer: Medicare Other

## 2012-09-22 ENCOUNTER — Ambulatory Visit (HOSPITAL_COMMUNITY): Payer: Medicare Other

## 2012-09-23 ENCOUNTER — Ambulatory Visit (INDEPENDENT_AMBULATORY_CARE_PROVIDER_SITE_OTHER): Payer: Medicare Other | Admitting: Cardiology

## 2012-09-23 ENCOUNTER — Encounter: Payer: Self-pay | Admitting: Cardiology

## 2012-09-23 VITALS — BP 118/60 | HR 75 | Resp 18 | Ht 64.0 in | Wt 180.8 lb

## 2012-09-23 DIAGNOSIS — I10 Essential (primary) hypertension: Secondary | ICD-10-CM

## 2012-09-23 DIAGNOSIS — Z9119 Patient's noncompliance with other medical treatment and regimen: Secondary | ICD-10-CM

## 2012-09-23 DIAGNOSIS — Z9114 Patient's other noncompliance with medication regimen: Secondary | ICD-10-CM

## 2012-09-23 DIAGNOSIS — I2510 Atherosclerotic heart disease of native coronary artery without angina pectoris: Secondary | ICD-10-CM

## 2012-09-23 DIAGNOSIS — E119 Type 2 diabetes mellitus without complications: Secondary | ICD-10-CM

## 2012-09-23 NOTE — Patient Instructions (Addendum)
Your physician recommends that you schedule a follow-up appointment in: 6 weeks with Dr. Riley Kill.  Continue your current medications.
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
## 2012-09-23 NOTE — Assessment & Plan Note (Signed)
Better control at the present time.

## 2012-09-23 NOTE — Assessment & Plan Note (Signed)
Seems to be improved 

## 2012-09-23 NOTE — Assessment & Plan Note (Signed)
Remains stable

## 2012-09-23 NOTE — Assessment & Plan Note (Signed)
Improved control per patient.

## 2012-09-24 ENCOUNTER — Ambulatory Visit (HOSPITAL_COMMUNITY): Payer: Medicare Other

## 2012-09-26 ENCOUNTER — Ambulatory Visit (HOSPITAL_COMMUNITY): Payer: Medicare Other

## 2012-09-29 ENCOUNTER — Ambulatory Visit (HOSPITAL_COMMUNITY): Payer: Medicare Other

## 2012-10-01 ENCOUNTER — Ambulatory Visit (HOSPITAL_COMMUNITY): Payer: Medicare Other

## 2012-10-03 ENCOUNTER — Ambulatory Visit (HOSPITAL_COMMUNITY): Payer: Medicare Other

## 2012-10-06 ENCOUNTER — Ambulatory Visit (HOSPITAL_COMMUNITY): Payer: Medicare Other

## 2012-10-08 ENCOUNTER — Ambulatory Visit (HOSPITAL_COMMUNITY): Payer: Medicare Other

## 2012-10-09 ENCOUNTER — Emergency Department (HOSPITAL_COMMUNITY)
Admission: EM | Admit: 2012-10-09 | Discharge: 2012-10-09 | Disposition: A | Discharge status: Home / Self Care | Payer: Medicare Other | Attending: Emergency Medicine | Admitting: Emergency Medicine

## 2012-10-09 ENCOUNTER — Emergency Department (HOSPITAL_COMMUNITY): Payer: Medicare Other

## 2012-10-09 ENCOUNTER — Encounter (HOSPITAL_COMMUNITY): Payer: Self-pay | Admitting: Emergency Medicine

## 2012-10-09 ENCOUNTER — Telehealth: Payer: Self-pay | Admitting: Cardiology

## 2012-10-09 DIAGNOSIS — R55 Syncope and collapse: Secondary | ICD-10-CM | POA: Insufficient documentation

## 2012-10-09 DIAGNOSIS — R0789 Other chest pain: Secondary | ICD-10-CM | POA: Insufficient documentation

## 2012-10-09 DIAGNOSIS — I252 Old myocardial infarction: Secondary | ICD-10-CM | POA: Insufficient documentation

## 2012-10-09 DIAGNOSIS — R443 Hallucinations, unspecified: Secondary | ICD-10-CM | POA: Insufficient documentation

## 2012-10-09 DIAGNOSIS — Z87448 Personal history of other diseases of urinary system: Secondary | ICD-10-CM | POA: Insufficient documentation

## 2012-10-09 DIAGNOSIS — R110 Nausea: Secondary | ICD-10-CM | POA: Insufficient documentation

## 2012-10-09 DIAGNOSIS — I209 Angina pectoris, unspecified: Secondary | ICD-10-CM | POA: Insufficient documentation

## 2012-10-09 DIAGNOSIS — F411 Generalized anxiety disorder: Secondary | ICD-10-CM | POA: Insufficient documentation

## 2012-10-09 DIAGNOSIS — R61 Generalized hyperhidrosis: Secondary | ICD-10-CM | POA: Insufficient documentation

## 2012-10-09 DIAGNOSIS — Z8611 Personal history of tuberculosis: Secondary | ICD-10-CM | POA: Insufficient documentation

## 2012-10-09 DIAGNOSIS — G40909 Epilepsy, unspecified, not intractable, without status epilepticus: Secondary | ICD-10-CM | POA: Insufficient documentation

## 2012-10-09 DIAGNOSIS — F3289 Other specified depressive episodes: Secondary | ICD-10-CM | POA: Insufficient documentation

## 2012-10-09 DIAGNOSIS — Z87828 Personal history of other (healed) physical injury and trauma: Secondary | ICD-10-CM | POA: Insufficient documentation

## 2012-10-09 DIAGNOSIS — Z9861 Coronary angioplasty status: Secondary | ICD-10-CM | POA: Insufficient documentation

## 2012-10-09 DIAGNOSIS — F419 Anxiety disorder, unspecified: Secondary | ICD-10-CM

## 2012-10-09 DIAGNOSIS — Z8639 Personal history of other endocrine, nutritional and metabolic disease: Secondary | ICD-10-CM | POA: Insufficient documentation

## 2012-10-09 DIAGNOSIS — Z794 Long term (current) use of insulin: Secondary | ICD-10-CM | POA: Insufficient documentation

## 2012-10-09 DIAGNOSIS — F329 Major depressive disorder, single episode, unspecified: Secondary | ICD-10-CM

## 2012-10-09 DIAGNOSIS — Z9114 Patient's other noncompliance with medication regimen: Secondary | ICD-10-CM

## 2012-10-09 DIAGNOSIS — Z9119 Patient's noncompliance with other medical treatment and regimen: Secondary | ICD-10-CM | POA: Insufficient documentation

## 2012-10-09 DIAGNOSIS — Z8659 Personal history of other mental and behavioral disorders: Secondary | ICD-10-CM | POA: Insufficient documentation

## 2012-10-09 DIAGNOSIS — Z8719 Personal history of other diseases of the digestive system: Secondary | ICD-10-CM | POA: Insufficient documentation

## 2012-10-09 DIAGNOSIS — Z7901 Long term (current) use of anticoagulants: Secondary | ICD-10-CM | POA: Insufficient documentation

## 2012-10-09 DIAGNOSIS — Z91199 Patient's noncompliance with other medical treatment and regimen due to unspecified reason: Secondary | ICD-10-CM | POA: Insufficient documentation

## 2012-10-09 DIAGNOSIS — Z79899 Other long term (current) drug therapy: Secondary | ICD-10-CM | POA: Insufficient documentation

## 2012-10-09 DIAGNOSIS — F200 Paranoid schizophrenia: Secondary | ICD-10-CM | POA: Insufficient documentation

## 2012-10-09 DIAGNOSIS — Z862 Personal history of diseases of the blood and blood-forming organs and certain disorders involving the immune mechanism: Secondary | ICD-10-CM | POA: Insufficient documentation

## 2012-10-09 DIAGNOSIS — Z8669 Personal history of other diseases of the nervous system and sense organs: Secondary | ICD-10-CM | POA: Insufficient documentation

## 2012-10-09 DIAGNOSIS — E1149 Type 2 diabetes mellitus with other diabetic neurological complication: Secondary | ICD-10-CM | POA: Insufficient documentation

## 2012-10-09 DIAGNOSIS — I10 Essential (primary) hypertension: Secondary | ICD-10-CM

## 2012-10-09 DIAGNOSIS — G909 Disorder of the autonomic nervous system, unspecified: Secondary | ICD-10-CM | POA: Insufficient documentation

## 2012-10-09 DIAGNOSIS — R079 Chest pain, unspecified: Secondary | ICD-10-CM

## 2012-10-09 DIAGNOSIS — I2510 Atherosclerotic heart disease of native coronary artery without angina pectoris: Secondary | ICD-10-CM | POA: Insufficient documentation

## 2012-10-09 DIAGNOSIS — R0602 Shortness of breath: Secondary | ICD-10-CM | POA: Insufficient documentation

## 2012-10-09 LAB — CBC
MCV: 89.1 fL (ref 78.0–100.0)
Platelets: 144 10*3/uL — ABNORMAL LOW (ref 150–400)
RBC: 5.22 MIL/uL (ref 4.22–5.81)
WBC: 6.7 10*3/uL (ref 4.0–10.5)

## 2012-10-09 LAB — POCT I-STAT TROPONIN I: Troponin i, poc: 0 ng/mL (ref 0.00–0.08)

## 2012-10-09 LAB — COMPREHENSIVE METABOLIC PANEL
AST: 34 U/L (ref 0–37)
Alkaline Phosphatase: 90 U/L (ref 39–117)
BUN: 11 mg/dL (ref 6–23)
CO2: 23 mEq/L (ref 19–32)
Chloride: 99 mEq/L (ref 96–112)
Creatinine, Ser: 0.84 mg/dL (ref 0.50–1.35)
GFR calc non Af Amer: 89 mL/min — ABNORMAL LOW (ref >90)
Potassium: 3.8 mEq/L (ref 3.5–5.1)
Total Bilirubin: 0.8 mg/dL (ref 0.3–1.2)

## 2012-10-09 MED ORDER — LORAZEPAM 1 MG PO TABS
1.0000 mg | ORAL_TABLET | Freq: Once | ORAL | Status: AC
  Administered 2012-10-09: 1 mg via ORAL
  Filled 2012-10-09: qty 1

## 2012-10-09 NOTE — Telephone Encounter (Signed)
Pt not on other end of phone when transferred.
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
## 2012-10-09 NOTE — ED Provider Notes (Signed)
Medical screening examination/treatment/procedure(s) were conducted as a shared visit with non-physician practitioner(s) and myself.  I personally evaluated the patient during the encounter  Bruce [name] is a 67 y.o. male hx of CAD, depression here with chest pain. EKG unremarkable. PA called Dr. Shirlee Latch at [facility], who recommended trop x 3. Trop neg x 3 here. Patient d/c with outpatient f/u.    Richardean Canal, MD 10/09/12 [phone]

## 2012-10-09 NOTE — ED Notes (Signed)
Hannah, PA at the bedside.  

## 2012-10-09 NOTE — Telephone Encounter (Signed)
Pt helped push a car yesterday and he is having some chest discomfort and he spent last night crying due to the stress

## 2012-10-09 NOTE — ED Notes (Signed)
Radiology contacted for pt to be brought to room 1 in pod A after xray

## 2012-10-09 NOTE — ED Notes (Signed)
Report given to [facility] for Pod C.
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
## 2012-10-10 ENCOUNTER — Ambulatory Visit (HOSPITAL_COMMUNITY): Payer: Medicare Other

## 2012-10-13 ENCOUNTER — Ambulatory Visit (HOSPITAL_COMMUNITY): Payer: Medicare Other

## 2012-10-15 ENCOUNTER — Ambulatory Visit (HOSPITAL_COMMUNITY): Payer: Medicare Other

## 2012-10-17 ENCOUNTER — Encounter: Payer: Self-pay | Admitting: *Deleted

## 2012-10-17 ENCOUNTER — Encounter: Payer: Self-pay | Admitting: Physician Assistant

## 2012-10-17 ENCOUNTER — Ambulatory Visit (HOSPITAL_COMMUNITY): Payer: Medicare Other

## 2012-10-17 ENCOUNTER — Ambulatory Visit (INDEPENDENT_AMBULATORY_CARE_PROVIDER_SITE_OTHER): Payer: Medicare Other | Admitting: Physician Assistant

## 2012-10-17 VITALS — BP 156/88 | HR 52 | Ht 64.0 in | Wt 182.0 lb

## 2012-10-17 DIAGNOSIS — I2510 Atherosclerotic heart disease of native coronary artery without angina pectoris: Secondary | ICD-10-CM

## 2012-10-17 DIAGNOSIS — R072 Precordial pain: Secondary | ICD-10-CM

## 2012-10-17 DIAGNOSIS — E119 Type 2 diabetes mellitus without complications: Secondary | ICD-10-CM

## 2012-10-17 DIAGNOSIS — I10 Essential (primary) hypertension: Secondary | ICD-10-CM

## 2012-10-17 LAB — CBC WITH DIFFERENTIAL/PLATELET
Basophils Relative: 0.3 % (ref 0.0–3.0)
Eosinophils Absolute: 0.4 10*3/uL (ref 0.0–0.7)
Eosinophils Relative: 7.3 % — ABNORMAL HIGH (ref 0.0–5.0)
Hemoglobin: 14.4 g/dL (ref 13.0–17.0)
Lymphocytes Relative: 30.8 % (ref 12.0–46.0)
MCHC: 34.1 g/dL (ref 30.0–36.0)
Monocytes Relative: 9 % (ref 3.0–12.0)
Neutro Abs: 3.1 10*3/uL (ref 1.4–7.7)
Neutrophils Relative %: 52.6 % (ref 43.0–77.0)
RBC: 4.58 Mil/uL (ref 4.22–5.81)
WBC: 5.9 10*3/uL (ref 4.5–10.5)

## 2012-10-17 LAB — BASIC METABOLIC PANEL
BUN: 6 mg/dL (ref 6–23)
Chloride: 103 mEq/L (ref 96–112)
GFR: 86.39 mL/min (ref >60.00)
Potassium: 3.8 mEq/L (ref 3.5–5.1)
Sodium: 135 mEq/L (ref 135–145)

## 2012-10-17 LAB — PROTIME-INR: Prothrombin Time: 11.2 s (ref 10.2–12.4)

## 2012-10-17 MED ORDER — ISOSORBIDE MONONITRATE ER 30 MG PO TB24: 15.0000 mg | ORAL_TABLET | Freq: Every day | ORAL | Status: DC
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
## 2012-10-17 NOTE — Patient Instructions (Addendum)
Your physician has requested that you have a LEFT cardiac catheterization WITH DR. Riley Kill ON 10/22/12 @ 9:30, SEE INSTRUCTION SHEET GIVEN TO YOU TODAY. Cardiac catheterization is used to diagnose and/or treat various heart conditions. Doctors may recommend this procedure for a number of different reasons. The most common reason is to evaluate chest pain. Chest pain can be a symptom of coronary artery disease (CAD), and cardiac catheterization can show whether plaque is narrowing or blocking your heart's arteries. This procedure is also used to evaluate the valves, as well as measure the blood flow and oxygen levels in different parts of your heart. For further information please visit [email or web address] Please follow instruction sheet, as given.  Your physician recommends that you return for lab work in: TODAY PRE CATH LABS BMET, PT/INR, CBC W/DIFF  MAKE SURE TO HOLD BOTH OF YOUR INSULINS THE MORNING OF THE CATH ON 10/22/12  START IMDUR 15 MG DAILY; RX SENT IN TODAY FOR YOU TO [facility]

## 2012-10-20 ENCOUNTER — Encounter: Payer: Self-pay | Admitting: Cardiology

## 2012-10-20 ENCOUNTER — Ambulatory Visit (HOSPITAL_COMMUNITY): Payer: Medicare Other

## 2012-10-20 NOTE — Telephone Encounter (Signed)
Pt  Was told to have someone with him after surgery wednesday for 24 hrs, cannot find anyone, was told dr Riley Kill can order home health to come out, pls call pt re this [phone]

## 2012-10-20 NOTE — Telephone Encounter (Signed)
This encounter was created in error - please disregard.

## 2012-10-22 ENCOUNTER — Encounter (HOSPITAL_BASED_OUTPATIENT_CLINIC_OR_DEPARTMENT_OTHER): Admission: RE | Payer: Self-pay | Source: Ambulatory Visit

## 2012-10-22 ENCOUNTER — Inpatient Hospital Stay (HOSPITAL_BASED_OUTPATIENT_CLINIC_OR_DEPARTMENT_OTHER): Admission: RE | Admit: 2012-10-22 | Payer: Medicare Other | Source: Ambulatory Visit | Admitting: Cardiology

## 2012-10-22 ENCOUNTER — Encounter (HOSPITAL_COMMUNITY): Payer: Self-pay | Admitting: Cardiology

## 2012-10-22 ENCOUNTER — Encounter (HOSPITAL_COMMUNITY)
Admission: RE | Disposition: A | Discharge status: Home / Self Care | Payer: Self-pay | Source: Ambulatory Visit | Attending: Cardiology

## 2012-10-22 ENCOUNTER — Ambulatory Visit (HOSPITAL_COMMUNITY): Payer: Medicare Other

## 2012-10-22 ENCOUNTER — Ambulatory Visit (HOSPITAL_COMMUNITY)
Admission: RE | Admit: 2012-10-22 | Discharge: 2012-10-23 | Disposition: A | Discharge status: Home / Self Care | Payer: Medicare Other | Source: Ambulatory Visit | Attending: Cardiology | Admitting: Cardiology

## 2012-10-22 DIAGNOSIS — I10 Essential (primary) hypertension: Secondary | ICD-10-CM | POA: Diagnosis present

## 2012-10-22 DIAGNOSIS — F3289 Other specified depressive episodes: Secondary | ICD-10-CM | POA: Insufficient documentation

## 2012-10-22 DIAGNOSIS — E876 Hypokalemia: Secondary | ICD-10-CM | POA: Diagnosis present

## 2012-10-22 DIAGNOSIS — Z9861 Coronary angioplasty status: Secondary | ICD-10-CM | POA: Insufficient documentation

## 2012-10-22 DIAGNOSIS — R072 Precordial pain: Secondary | ICD-10-CM

## 2012-10-22 DIAGNOSIS — I2510 Atherosclerotic heart disease of native coronary artery without angina pectoris: Secondary | ICD-10-CM | POA: Diagnosis present

## 2012-10-22 DIAGNOSIS — R079 Chest pain, unspecified: Secondary | ICD-10-CM | POA: Insufficient documentation

## 2012-10-22 DIAGNOSIS — E119 Type 2 diabetes mellitus without complications: Secondary | ICD-10-CM | POA: Diagnosis present

## 2012-10-22 DIAGNOSIS — F329 Major depressive disorder, single episode, unspecified: Secondary | ICD-10-CM | POA: Diagnosis present

## 2012-10-22 DIAGNOSIS — E1149 Type 2 diabetes mellitus with other diabetic neurological complication: Secondary | ICD-10-CM | POA: Insufficient documentation

## 2012-10-22 DIAGNOSIS — E785 Hyperlipidemia, unspecified: Secondary | ICD-10-CM | POA: Diagnosis present

## 2012-10-22 DIAGNOSIS — Z8611 Personal history of tuberculosis: Secondary | ICD-10-CM | POA: Insufficient documentation

## 2012-10-22 DIAGNOSIS — F209 Schizophrenia, unspecified: Secondary | ICD-10-CM | POA: Insufficient documentation

## 2012-10-22 DIAGNOSIS — E1142 Type 2 diabetes mellitus with diabetic polyneuropathy: Secondary | ICD-10-CM | POA: Insufficient documentation

## 2012-10-22 DIAGNOSIS — F32A Depression, unspecified: Secondary | ICD-10-CM | POA: Diagnosis present

## 2012-10-22 DIAGNOSIS — I252 Old myocardial infarction: Secondary | ICD-10-CM | POA: Insufficient documentation

## 2012-10-22 DIAGNOSIS — E783 Hyperchylomicronemia: Secondary | ICD-10-CM | POA: Insufficient documentation

## 2012-10-22 DIAGNOSIS — R0789 Other chest pain: Secondary | ICD-10-CM

## 2012-10-22 HISTORY — PX: LEFT HEART CATHETERIZATION WITH CORONARY ANGIOGRAM: SHX5451

## 2012-10-22 HISTORY — DX: Shortness of breath: R06.02

## 2012-10-22 LAB — GLUCOSE, CAPILLARY
Glucose-Capillary: 233 mg/dL — ABNORMAL HIGH (ref 70–99)
Glucose-Capillary: 188 mg/dL — ABNORMAL HIGH (ref 70–99)
Glucose-Capillary: 182 mg/dL — ABNORMAL HIGH (ref 70–99)
Glucose-Capillary: 170 mg/dL — ABNORMAL HIGH (ref 70–99)

## 2012-10-22 SURGERY — LEFT HEART CATHETERIZATION WITH CORONARY ANGIOGRAM
Anesthesia: LOCAL

## 2012-10-22 SURGERY — JV LEFT HEART CATHETERIZATION WITH CORONARY ANGIOGRAM

## 2012-10-22 MED ORDER — INSULIN ASPART 100 UNIT/ML ~~LOC~~ SOLN
0.0000 [IU] | Freq: Three times a day (TID) | SUBCUTANEOUS | Status: DC
  Administered 2012-10-22: 3 [IU] via SUBCUTANEOUS
  Administered 2012-10-23: 09:00:00 2 [IU] via SUBCUTANEOUS

## 2012-10-22 MED ORDER — SERTRALINE HCL 25 MG PO TABS
12.5000 mg | ORAL_TABLET | Freq: Every day | ORAL | Status: DC
  Administered 2012-10-23: 09:00:00 12.5 mg via ORAL
  Filled 2012-10-22: qty 0.5

## 2012-10-22 MED ORDER — VERAPAMIL HCL 2.5 MG/ML IV SOLN
INTRAVENOUS | Status: AC
  Filled 2012-10-22: qty 2

## 2012-10-22 MED ORDER — INSULIN ASPART PROT & ASPART (70-30 MIX) 100 UNIT/ML ~~LOC~~ SUSP
25.0000 [IU] | Freq: Every day | SUBCUTANEOUS | Status: DC
  Administered 2012-10-23: 25 [IU] via SUBCUTANEOUS
  Filled 2012-10-22: qty 10

## 2012-10-22 MED ORDER — NITROGLYCERIN 0.4 MG SL SUBL: 0.4000 mg | SUBLINGUAL_TABLET | SUBLINGUAL | Status: DC | PRN

## 2012-10-22 MED ORDER — MECLIZINE HCL 25 MG PO TABS
25.0000 mg | ORAL_TABLET | Freq: Three times a day (TID) | ORAL | Status: DC | PRN
  Filled 2012-10-22: qty 1

## 2012-10-22 MED ORDER — NITROGLYCERIN 1 MG/10 ML FOR IR/CATH LAB
INTRA_ARTERIAL | Status: AC
  Filled 2012-10-22: qty 10

## 2012-10-22 MED ORDER — TERAZOSIN HCL 5 MG PO CAPS
5.0000 mg | ORAL_CAPSULE | Freq: Every day | ORAL | Status: DC
  Administered 2012-10-22: 22:00:00 5 mg via ORAL
  Filled 2012-10-22 (×2): qty 1

## 2012-10-22 MED ORDER — ONDANSETRON HCL 4 MG/2ML IJ SOLN: 4.0000 mg | Freq: Four times a day (QID) | INTRAMUSCULAR | Status: DC | PRN

## 2012-10-22 MED ORDER — ASPIRIN 81 MG PO CHEW
324.0000 mg | CHEWABLE_TABLET | ORAL | Status: AC
  Administered 2012-10-22: 324 mg via ORAL

## 2012-10-22 MED ORDER — GABAPENTIN 300 MG PO CAPS
300.0000 mg | ORAL_CAPSULE | Freq: Every day | ORAL | Status: DC
Start: 2012-10-23 — End: 2012-10-23
  Administered 2012-10-23: 09:00:00 300 mg via ORAL
  Filled 2012-10-22: qty 1

## 2012-10-22 MED ORDER — HEPARIN SODIUM (PORCINE) 1000 UNIT/ML IJ SOLN
INTRAMUSCULAR | Status: AC
  Filled 2012-10-22: qty 1

## 2012-10-22 MED ORDER — CARBOXYMETHYLCELLULOSE SODIUM 1 % OP SOLN: 1.0000 [drp] | Freq: Two times a day (BID) | OPHTHALMIC | Status: DC | PRN

## 2012-10-22 MED ORDER — TICAGRELOR 90 MG PO TABS
90.0000 mg | ORAL_TABLET | Freq: Two times a day (BID) | ORAL | Status: DC
  Administered 2012-10-22 – 2012-10-23 (×2): 90 mg via ORAL
  Filled 2012-10-22 (×3): qty 1

## 2012-10-22 MED ORDER — METOPROLOL TARTRATE 12.5 MG HALF TABLET: 12.5000 mg | ORAL_TABLET | Freq: Two times a day (BID) | ORAL | Status: DC

## 2012-10-22 MED ORDER — ISOSORBIDE MONONITRATE 15 MG HALF TABLET
15.0000 mg | ORAL_TABLET | Freq: Every day | ORAL | Status: DC
  Administered 2012-10-23: 09:00:00 15 mg via ORAL
  Filled 2012-10-22: qty 1

## 2012-10-22 MED ORDER — ACETAMINOPHEN 325 MG PO TABS: 650.0000 mg | ORAL_TABLET | ORAL | Status: DC | PRN

## 2012-10-22 MED ORDER — SODIUM CHLORIDE 0.9 % IJ SOLN: 3.0000 mL | Freq: Two times a day (BID) | INTRAMUSCULAR | Status: DC

## 2012-10-22 MED ORDER — DORZOLAMIDE HCL 2 % OP SOLN
1.0000 [drp] | Freq: Every day | OPHTHALMIC | Status: DC
  Administered 2012-10-22: 22:00:00 1 [drp] via OPHTHALMIC
  Filled 2012-10-22: qty 10

## 2012-10-22 MED ORDER — LATANOPROST 0.005 % OP SOLN
1.0000 [drp] | Freq: Every day | OPHTHALMIC | Status: DC
  Administered 2012-10-22: 1 [drp] via OPHTHALMIC
  Filled 2012-10-22: qty 2.5

## 2012-10-22 MED ORDER — LIDOCAINE HCL (PF) 1 % IJ SOLN
INTRAMUSCULAR | Status: AC
  Filled 2012-10-22: qty 30

## 2012-10-22 MED ORDER — ASPIRIN 81 MG PO CHEW
CHEWABLE_TABLET | ORAL | Status: AC
  Filled 2012-10-22: qty 4

## 2012-10-22 MED ORDER — POLYVINYL ALCOHOL 1.4 % OP SOLN
1.0000 [drp] | Freq: Two times a day (BID) | OPHTHALMIC | Status: DC | PRN
  Filled 2012-10-22: qty 15

## 2012-10-22 MED ORDER — INSULIN ASPART PROT & ASPART (70-30 MIX) 100 UNIT/ML ~~LOC~~ SUSP
29.0000 [IU] | Freq: Every day | SUBCUTANEOUS | Status: DC
  Administered 2012-10-22: 18:00:00 29 [IU] via SUBCUTANEOUS
  Filled 2012-10-22: qty 10

## 2012-10-22 MED ORDER — INSULIN REGULAR HUMAN 100 UNIT/ML IJ SOLN: 3.0000 [IU] | Freq: Three times a day (TID) | INTRAMUSCULAR | Status: DC

## 2012-10-22 MED ORDER — SODIUM CHLORIDE 0.9 % IJ SOLN: 3.0000 mL | INTRAMUSCULAR | Status: DC | PRN

## 2012-10-22 MED ORDER — FENTANYL CITRATE 0.05 MG/ML IJ SOLN
INTRAMUSCULAR | Status: AC
  Filled 2012-10-22: qty 2

## 2012-10-22 MED ORDER — SODIUM CHLORIDE 0.9 % IV SOLN: INTRAVENOUS | Status: AC

## 2012-10-22 MED ORDER — SODIUM CHLORIDE 0.9 % IV SOLN
INTRAVENOUS | Status: DC
  Administered 2012-10-22: 09:00:00 via INTRAVENOUS

## 2012-10-22 MED ORDER — CHLORHEXIDINE GLUCONATE 0.12 % MT SOLN
15.0000 mL | Freq: Every day | OROMUCOSAL | Status: DC
  Administered 2012-10-23: 09:00:00 15 mL via OROMUCOSAL
  Filled 2012-10-22: qty 15

## 2012-10-22 MED ORDER — INSULIN NPH ISOPHANE & REGULAR (70-30) 100 UNIT/ML ~~LOC~~ SUSP: 25.0000 [IU] | Freq: Two times a day (BID) | SUBCUTANEOUS | Status: DC

## 2012-10-22 MED ORDER — ASPIRIN 81 MG PO CHEW
81.0000 mg | CHEWABLE_TABLET | Freq: Every day | ORAL | Status: DC
  Administered 2012-10-23: 81 mg via ORAL
  Filled 2012-10-22 (×2): qty 1

## 2012-10-22 MED ORDER — MIDAZOLAM HCL 2 MG/2ML IJ SOLN
INTRAMUSCULAR | Status: AC
  Filled 2012-10-22: qty 2

## 2012-10-22 MED ORDER — METOPROLOL TARTRATE 12.5 MG HALF TABLET
12.5000 mg | ORAL_TABLET | Freq: Two times a day (BID) | ORAL | Status: DC
  Administered 2012-10-23: 12.5 mg via ORAL
  Filled 2012-10-22 (×2): qty 1

## 2012-10-22 MED ORDER — SODIUM CHLORIDE 0.9 % IV SOLN
250.0000 mL | INTRAVENOUS | Status: DC | PRN
Start: 2012-10-22 — End: 2012-10-22

## 2012-10-22 MED ORDER — HEPARIN (PORCINE) IN NACL 2-0.9 UNIT/ML-% IJ SOLN
INTRAMUSCULAR | Status: AC
  Filled 2012-10-22: qty 2000

## 2012-10-22 NOTE — Progress Notes (Signed)
TR BAND REMOVAL  LOCATION:    right radial  DEFLATED PER PROTOCOL:    yes  TIME BAND OFF / DRESSING APPLIED:    1530   SITE UPON ARRIVAL:    Level 0  SITE AFTER BAND REMOVAL:    Level 0  REVERSE ALLEN'S TEST:     positive  CIRCULATION SENSATION AND MOVEMENT:    Within Normal Limits   yes  COMMENTS:   Removed TR band and noted oozing from site, dressing applied and pressure held for 10 minutes. Rechecked site at 1600 Dressing dry and intact, positive reverse allens, csms wnls, radial and ulnar pulse +2.
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
## 2012-10-22 NOTE — Interval H&P Note (Signed)
History and Physical Interval Note:  10/22/2012 10:48 AM  Bruce [name]  has presented today for surgery, with the diagnosis of cp  The various methods of treatment have been discussed with the patient. After consideration of risks, benefits and other options for treatment, the patient has consented to  Procedure(s) (LRB) with comments: LEFT HEART CATHETERIZATION WITH CORONARY ANGIOGRAM (N/A) as a surgical intervention .  The patient's history has been reviewed, patient examined, no change in status, stable for surgery.  I have reviewed the patient's chart and labs.  Questions were answered to the patient's satisfaction.     [name]

## 2012-10-22 NOTE — Progress Notes (Signed)
Stable.  Reviewed findings with patient in detail.   Plan home in am.  He has nobody at home to provide care so will do so here tonight as a OP with prolonged observation.

## 2012-10-22 NOTE — CV Procedure (Signed)
   Cardiac Catheterization Procedure Note  Name: Bruce [name] MRN: [id number] DOB: [birth year]  Procedure: Left Heart Cath, Selective Coronary Angiography, LV angiography  Indication: recurrent, somewhat atypical chest pain   Procedural Details: The right wrist was prepped, draped, and anesthetized with 1% lidocaine. Using the modified Seldinger technique, a 5 French sheath was introduced into the right radial artery. 3 mg of verapamil was administered through the sheath, weight-based unfractionated heparin was administered intravenously. A standard left and pigtail, and RC bypass catheters were used for selective coronary angiography and left ventriculography. Catheter exchanges were performed over an exchange length guidewire. There were no immediate procedural complications. A TR band was used for radial hemostasis at the completion of the procedure.  The patient was transferred to the post catheterization recovery area for further monitoring.  Procedural Findings: Hemodynamics: AO 92/57 (70) LV 112/13 No gradient on pullback.    Coronary angiography: Coronary dominance: right  Left mainstem: 10% tapered narrowing with mild calcification.   Left anterior descending (LAD): provides a septal, two diagonals and courses to the apex.  After the septal there is about 30% narrowing at the takeoff of the two diagonals.  The first is small, and has 80% ostial narrowing.  The second has 40% proximal narrowing.  The distal LAD has minimal irregularity.    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   The left derived vessel has 30% mid narrowing with some irregularity but no critical disease.  There is a tiny ramus subbranch with 80% narrowing.    The RCA derived OM (anomalous), as on the last has a segmental 70-80% narrowing, likely unchanged from the prior study.  The vessel distally consists of two subbranches  without critical disease.   Right coronary artery (RCA): The prior infarct artery is stented and remains widely patent.  There is a large AM branch with 50% proximal narrowing.The distal vessel has 30% narrowing prior to the PDA and PLA.    Left ventriculography: Left ventricular systolic function is normal, LVEF is estimated at 55-65%, there is no significant mitral regurgitation.  There is minimal inferior hypokinesis.     Final Conclusions:   1.  Preserved LV function 2.  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  3.  Continued narrowing of the anomalous circ, similar to prior study  (at that time FFR was .87) 4.  Other lesions as noted.   Recommendations:  1.  Would lean toward continued medical therapy in Bruce [name].  His lesion looks similar, has been studied with FFR, and symptoms are not severe or classic.  Notably, he has had prior stent thrombosis for medical non compliance  (also issues with insulin overdose in the past), so am reluctant to place stent at this point.  Would favor continued meds, and this was explained to the patient.  Early follow up at [facility].    [name] 10/22/2012, 12:28 PM

## 2012-10-23 ENCOUNTER — Encounter (HOSPITAL_COMMUNITY): Payer: Self-pay | Admitting: Nurse Practitioner

## 2012-10-23 DIAGNOSIS — R072 Precordial pain: Secondary | ICD-10-CM

## 2012-10-23 DIAGNOSIS — R0789 Other chest pain: Secondary | ICD-10-CM

## 2012-10-23 LAB — GLUCOSE, CAPILLARY: Glucose-Capillary: 152 mg/dL — ABNORMAL HIGH (ref 70–99)

## 2012-10-23 LAB — BASIC METABOLIC PANEL
BUN: 10 mg/dL (ref 6–23)
Chloride: 103 mEq/L (ref 96–112)
GFR calc Af Amer: >90 mL/min (ref >90)
GFR calc non Af Amer: >90 mL/min (ref >90)
Potassium: 3.1 mEq/L — ABNORMAL LOW (ref 3.5–5.1)
Sodium: 138 mEq/L (ref 135–145)

## 2012-10-23 MED ORDER — POTASSIUM CHLORIDE CRYS ER 20 MEQ PO TBCR
40.0000 meq | EXTENDED_RELEASE_TABLET | Freq: Once | ORAL | Status: AC
  Administered 2012-10-23: 40 meq via ORAL
  Filled 2012-10-23: qty 2

## 2012-10-23 MED ORDER — POTASSIUM CHLORIDE CRYS ER 20 MEQ PO TBCR
20.0000 meq | EXTENDED_RELEASE_TABLET | Freq: Once | ORAL | Status: AC
  Administered 2012-10-23: 20 meq via ORAL
  Filled 2012-10-23: qty 1

## 2012-10-23 NOTE — Progress Notes (Signed)
Patient Name: Bruce [name] Date of Encounter: 10/23/2012     Active Problems:  * No active hospital problems. *     SUBJECTIVE  No symptoms overnight.  Doing pretty well.  K down a bit.    CURRENT MEDS    . aspirin  81 mg Oral Daily  . chlorhexidine  15 mL Mouth/Throat Daily  . dorzolamide  1 drop Both Eyes QHS  . gabapentin  300 mg Oral Daily  . insulin aspart  0-15 Units Subcutaneous TID WC  . insulin aspart protamine-insulin aspart  25 Units Subcutaneous Q breakfast  . insulin aspart protamine-insulin aspart  29 Units Subcutaneous Q supper  . isosorbide mononitrate  15 mg Oral Daily  . latanoprost  1 drop Left Eye QHS  . metoprolol tartrate  12.5 mg Oral BID  . sertraline  12.5 mg Oral Daily  . terazosin  5 mg Oral QHS  . Ticagrelor  90 mg Oral BID    OBJECTIVE  Filed Vitals:   10/23/12 0005 10/23/12 0425 10/23/12 0427 10/23/12 0705  BP: 137/70 122/70  139/92  Pulse:    61  Temp:   97.9 F (36.6 C) 98.2 F (36.8 C)  TempSrc:   Oral Oral  Resp: 17 19  19   Height:      Weight:      SpO2:   95% 99%    Intake/Output Summary (Last 24 hours) at 10/23/12 0851 Last data filed at 10/23/12 0700  Gross per 24 hour  Intake    450 ml  Output   1875 ml  Net  -1425 ml   Filed Weights   10/22/12 0847 10/23/12 0000  Weight: 179 lb (81.194 kg) 181 lb 10.5 oz (82.4 kg)    PHYSICAL EXAM  General: Pleasant, NAD. Neuro: Alert and oriented X 3. Moves all extremities spontaneously. Psych: Normal affect. HEENT:  Normal  Neck: Supple without bruits or JVD. Lungs:  Resp regular and unlabored, CTA. Heart: RRR no s3, s4, or murmurs. Extremities: No clubbing, cyanosis or edema. DP/PT/Radials 2+ and equal bilaterally.  Cath site, right radial, looks good.    Accessory Clinical Findings  CBC No results found for this basename: WBC:2,NEUTROABS:2,HGB:2,HCT:2,MCV:2,PLT:2 in the last 72 hours Basic Metabolic Panel  Basename 10/23/12 0420  NA 138  K 3.1*  CL 103    CO2 24  GLUCOSE 90  BUN 10  CREATININE 0.77  CALCIUM 9.4  MG --  PHOS --   Liver Function Tests No results found for this basename: AST:2,ALT:2,ALKPHOS:2,BILITOT:2,PROT:2,ALBUMIN:2 in the last 72 hours No results found for this basename: LIPASE:2,AMYLASE:2 in the last 72 hours Cardiac Enzymes No results found for this basename: CKTOTAL:3,CKMB:3,CKMBINDEX:3,TROPONINI:3 in the last 72 hours BNP No components found with this basename: POCBNP:3 D-Dimer No results found for this basename: DDIMER:2 in the last 72 hours Hemoglobin A1C No results found for this basename: HGBA1C in the last 72 hours Fasting Lipid Panel No results found for this basename: CHOL,HDL,LDLCALC,TRIG,CHOLHDL,LDLDIRECT in the last 72 hours Thyroid Function Tests No results found for this basename: TSH,T4TOTAL,FREET3,T3FREE,THYROIDAB in the last 72 hours    Radiology/Studies  Dg Chest 2 View  10/09/2012  *RADIOLOGY REPORT*  Clinical Data: Left-sided chest pain  CHEST - 2 VIEW  Comparison: 06/30/2012  Findings: Cardiomediastinal silhouette is stable.  No acute infiltrate or pleural effusion.  No pulmonary edema.  Mild degenerative changes thoracic spine.  IMPRESSION: No active disease.  No significant change.   Original Report Authenticated By: Natasha Mead, M.D.  ASSESSMENT AND PLAN 1.  Chest pain  ----  Cath unchanged from prior.  Continue to monitor medically on meds.  If continued symptoms, then consider PCI.  See my note.   2.  DM   Not great control but [facility]. 3. Hypokalemia  -replace today then hom  Signed, [name] MD, [facility], FSCAI

## 2012-10-23 NOTE — Discharge Summary (Signed)
Patient ID: Bruce [name],  MRN: [id number], DOB/AGE: 1945/10/29 67 y.o.  Admit date: 10/22/2012 Discharge date: 10/23/2012  Primary Cardiologist: T. Riley Kill, MD  Discharge Diagnoses Principal Problem:  *Midsternal chest pain  **s/p cath this admission revealing stable anatomy-> continue Med Rx. Active Problems:  CAD (coronary artery disease)  Schizophrenia  Hypertension  Type 2 diabetes mellitus  Hypokalemia  Hyperlipidemia  Depression  Allergies Allergies  Allergen Reactions  . Codeine Other (See Comments)    "causes me to pass out; regain consciousness; pass out, regain consciousness; like a ping pong - back and forth"  . Cogentin (Benztropine) Other (See Comments)    "like I'm heavily sedated/drugged; hard for me to comprehend things" (07/25/2012)    Procedures  Cardiac Catheterization 10/22/2012  Procedural Findings: Hemodynamics: AO 92/57 (70) LV 112/13 No gradient on pullback.    Coronary angiography: Coronary dominance: right  Left mainstem: 10% tapered narrowing with mild calcification.   Left anterior descending (LAD): provides a septal, two diagonals and courses to the apex.  After the septal there is about 30% narrowing at the takeoff of the two diagonals.  The first is small, and has 80% ostial narrowing.  The second has 40% proximal narrowing.  The distal LAD has minimal irregularity.    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   The left derived vessel has 30% mid narrowing with some irregularity but no critical disease.  There is a tiny ramus subbranch with 80% narrowing.   The RCA derived OM (anomalous), as on the last has a segmental 70-80% narrowing, likely unchanged from the prior study.  The vessel distally consists of two subbranches without critical disease.   Right coronary artery (RCA): The prior infarct artery is stented and remains widely patent.  There is a large AM  branch with 50% proximal narrowing.The distal vessel has 30% narrowing prior to the PDA and PLA.    Left ventriculography: Left ventricular systolic function is normal, LVEF is estimated at 55-65%, there is no significant mitral regurgitation.  There is minimal inferior hypokinesis.    Final Conclusions:   1.  Preserved LV function 2.  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   3.  Continued narrowing of the anomalous circ, similar to prior study  (at that time FFR was .87) 4.  Other lesions as noted.  _____________  History of Present Illness  67 year old male with the above complex problem list. He was recently seen in clinic on January 31 with complaints of intermittent chest pain somewhat similar to prior angina. Decision was made to pursue outpatient cardiac catheterization.  Hospital Course   patient presented to the cardiac catheterization lab on February 5, and underwent diagnostic catheterization with results as outlined above. Overall, he had no significant change in anatomy. 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 In the past, fractional flow reserve was normal within this area. Given stability of angiographic findings, continue medical therapy was recommended. Patient remained in the hospital to be observed overnight and has been ambulating this morning without recurrent symptoms or limitations. He will be discharged home today in good condition.  Discharge Vitals Blood pressure 139/92, pulse 61, temperature 98.2 F (36.8 C), temperature source Oral, resp. rate 19, height 5\' 4"  (1.626 m), weight 181 lb 10.5 oz (82.4 kg), SpO2 99.00%.  Filed Weights   10/22/12 0847 10/23/12  0000  Weight: 179 lb (81.194 kg) 181 lb 10.5 oz (82.4 kg)   Labs  CBC  Lab Results  Component Value Date   WBC 5.9 10/17/2012   HGB 14.4 10/17/2012   HCT 42.3 10/17/2012   MCV 92.5 10/17/2012   PLT 152.0 10/17/2012    Basic  Metabolic Panel  Basename 10/23/12 0420  NA 138  K 3.1*  CL 103  CO2 24  GLUCOSE 90  BUN 10  CREATININE 0.77  CALCIUM 9.4  MG --  PHOS --   Disposition  Pt is being discharged home today in good condition.  Follow-up Plans & Appointments  Follow-up Information    Follow up with [name], MD. On 11/03/2012. (9:15 AM)    Contact information:   [address] [place] [address] [phone]        Discharge Medications    Medication List     As of 10/23/2012 10:53 AM    TAKE these medications         aspirin 81 MG chewable tablet   Chew 1 tablet (81 mg total) by mouth daily.      carboxymethylcellulose 1 % ophthalmic solution   Place 1 drop into both eyes 2 (two) times daily as needed. For dryness      chlorhexidine 0.12 % solution   Commonly known as: PERIDEX   Use as directed 15 mLs in the mouth or throat daily.      dorzolamide 2 % ophthalmic solution   Commonly known as: TRUSOPT   Place 1 drop into both eyes at bedtime.      gabapentin 600 MG tablet   Commonly known as: NEURONTIN   Take 300 mg by mouth daily.      insulin NPH-insulin regular (70-30) 100 UNIT/ML injection   Commonly known as: NOVOLIN 70/30   Inject 25-29 Units into the skin 2 (two) times daily with a meal. 25 units in the morning  29 units in the evening      insulin regular 100 units/mL injection   Commonly known as: NOVOLIN R,HUMULIN R   Inject 3-20 Units into the skin 3 (three) times daily before meals. Per sliding scale.      isosorbide mononitrate 30 MG 24 hr tablet   Commonly known as: IMDUR   Take 0.5 tablets (15 mg total) by mouth daily.      latanoprost 0.005 % ophthalmic solution   Commonly known as: XALATAN   Place 1 drop into the left eye at bedtime.      meclizine 25 MG tablet   Commonly known as: ANTIVERT   Take 25 mg by mouth 3 (three) times daily as needed. For dizziness      metoprolol tartrate 25 MG tablet   Commonly known as:  LOPRESSOR   Take 0.5 tablets (12.5 mg total) by mouth 2 (two) times daily.      nitroGLYCERIN 0.4 MG SL tablet   Commonly known as: NITROSTAT   Place 0.4 mg under the tongue every 5 (five) minutes as needed. For chest pain      sertraline 25 MG tablet   Commonly known as: ZOLOFT   Take 12.5 mg by mouth daily. Take 1/2 a tablet by mouth once a day      terazosin 5 MG capsule   Commonly known as: HYTRIN   Take 5 mg by mouth at bedtime.      Ticagrelor 90 MG Tabs tablet   Commonly known as:  BRILINTA   Take 1 tablet (90 mg total) by mouth 2 (two) times daily.      Outstanding Labs/Studies  None  Duration of Discharge Encounter   Greater than 30 minutes including physician time.  Signed, Nicolasa Ducking NP 10/23/2012, 10:53 AM

## 2012-10-24 ENCOUNTER — Telehealth: Payer: Self-pay | Admitting: *Deleted

## 2012-10-24 ENCOUNTER — Ambulatory Visit (HOSPITAL_COMMUNITY): Payer: Medicare Other

## 2012-10-24 NOTE — Telephone Encounter (Signed)
TCM patient . Spoke with pt, he is doing well. Pt  has all her prescribed medication and knows how to take them. Pt is aware of his O/V with Dr Riley Kill on 10/03/12 at 9:15 AM.

## 2012-10-27 ENCOUNTER — Ambulatory Visit (HOSPITAL_COMMUNITY): Payer: Medicare Other

## 2012-10-27 NOTE — Discharge Summary (Signed)
The patient was seen and evaluated.  He is ready for dc.  Cath study reviewed in detail with patient.  Will continue with medical therapy at present.  TS

## 2012-10-29 ENCOUNTER — Ambulatory Visit (HOSPITAL_COMMUNITY): Payer: Medicare Other

## 2012-10-31 ENCOUNTER — Ambulatory Visit (HOSPITAL_COMMUNITY): Payer: Medicare Other

## 2012-11-03 ENCOUNTER — Ambulatory Visit (INDEPENDENT_AMBULATORY_CARE_PROVIDER_SITE_OTHER): Payer: Medicare Other | Admitting: Cardiology

## 2012-11-03 ENCOUNTER — Encounter: Payer: Self-pay | Admitting: Cardiology

## 2012-11-03 ENCOUNTER — Ambulatory Visit (HOSPITAL_COMMUNITY): Payer: Medicare Other

## 2012-11-03 VITALS — BP 107/69 | HR 65 | Ht 64.0 in | Wt 178.0 lb

## 2012-11-03 DIAGNOSIS — I10 Essential (primary) hypertension: Secondary | ICD-10-CM

## 2012-11-03 DIAGNOSIS — I2510 Atherosclerotic heart disease of native coronary artery without angina pectoris: Secondary | ICD-10-CM

## 2012-11-03 LAB — CBC WITH DIFFERENTIAL/PLATELET
Basophils Relative: 0.5 % (ref 0.0–3.0)
Eosinophils Relative: 5.5 % — ABNORMAL HIGH (ref 0.0–5.0)
Lymphocytes Relative: 32.1 % (ref 12.0–46.0)
MCV: 93.6 fl (ref 78.0–100.0)
Monocytes Absolute: 0.6 10*3/uL (ref 0.1–1.0)
Neutrophils Relative %: 53.5 % (ref 43.0–77.0)
Platelets: 162 10*3/uL (ref 150.0–400.0)
RBC: 4.45 Mil/uL (ref 4.22–5.81)
WBC: 7.7 10*3/uL (ref 4.5–10.5)

## 2012-11-03 LAB — BASIC METABOLIC PANEL
BUN: 22 mg/dL (ref 6–23)
Calcium: 9.4 mg/dL (ref 8.4–10.5)
Creatinine, Ser: 1 mg/dL (ref 0.4–1.5)
GFR: 78.53 mL/min (ref >60.00)

## 2012-11-03 NOTE — Assessment & Plan Note (Signed)
BP is soft but stable, and pulse is ok.  Will continue to follow closely.

## 2012-11-03 NOTE — Assessment & Plan Note (Signed)
See my recent note. We elected to continue to follow him medically. It is somewhat similar, and the FFR at that time was normal. We will continue to follow him closely.  No new changes in meds.  Will check CBC, and monitor Hgb.  Hopefully we can transition soon to single antiplatelet treatment.
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
## 2012-11-03 NOTE — Patient Instructions (Signed)
Your physician recommends that you have lab work today: BMP and CBC  Your physician recommends that you schedule a follow-up appointment in: 3 WEEKS with Dr Riley Kill  Your physician recommends that you continue on your current medications as directed. Please refer to the Current Medication list given to you today.

## 2012-11-05 ENCOUNTER — Ambulatory Visit (HOSPITAL_COMMUNITY): Payer: Medicare Other

## 2012-11-07 ENCOUNTER — Ambulatory Visit (HOSPITAL_COMMUNITY): Payer: Medicare Other

## 2012-11-10 ENCOUNTER — Ambulatory Visit (HOSPITAL_COMMUNITY): Payer: Medicare Other

## 2012-11-12 ENCOUNTER — Ambulatory Visit (HOSPITAL_COMMUNITY): Payer: Medicare Other

## 2012-11-14 ENCOUNTER — Ambulatory Visit (HOSPITAL_COMMUNITY): Payer: Medicare Other

## 2012-11-18 ENCOUNTER — Telehealth: Payer: Self-pay | Admitting: Cardiology

## 2012-11-18 NOTE — Telephone Encounter (Signed)
PER DR STUCKEY - NO EPI  MS CHEN IS AWARE PER DEBBIE LEFLER,LPN

## 2012-11-18 NOTE — Telephone Encounter (Signed)
New problem    Patient in the office now for dental work . Need cardiac clearance for due to recent Heart Attack.

## 2012-11-25 ENCOUNTER — Ambulatory Visit: Payer: Medicare Other | Admitting: Cardiology

## 2012-12-29 ENCOUNTER — Ambulatory Visit (INDEPENDENT_AMBULATORY_CARE_PROVIDER_SITE_OTHER): Payer: Medicare Other | Admitting: Cardiology

## 2012-12-29 ENCOUNTER — Encounter: Payer: Self-pay | Admitting: Cardiology

## 2012-12-29 VITALS — BP 120/70 | HR 65 | Ht 64.0 in | Wt 179.0 lb

## 2012-12-29 DIAGNOSIS — F4310 Post-traumatic stress disorder, unspecified: Secondary | ICD-10-CM

## 2012-12-29 DIAGNOSIS — E785 Hyperlipidemia, unspecified: Secondary | ICD-10-CM

## 2012-12-29 DIAGNOSIS — D696 Thrombocytopenia, unspecified: Secondary | ICD-10-CM

## 2012-12-29 DIAGNOSIS — Z9114 Patient's other noncompliance with medication regimen: Secondary | ICD-10-CM

## 2012-12-29 DIAGNOSIS — I2510 Atherosclerotic heart disease of native coronary artery without angina pectoris: Secondary | ICD-10-CM

## 2012-12-29 DIAGNOSIS — E119 Type 2 diabetes mellitus without complications: Secondary | ICD-10-CM

## 2012-12-29 DIAGNOSIS — F329 Major depressive disorder, single episode, unspecified: Secondary | ICD-10-CM

## 2012-12-29 DIAGNOSIS — I10 Essential (primary) hypertension: Secondary | ICD-10-CM

## 2012-12-29 DIAGNOSIS — Z9119 Patient's noncompliance with other medical treatment and regimen: Secondary | ICD-10-CM

## 2012-12-29 NOTE — Assessment & Plan Note (Signed)
Being treated at the [facility]--associated with PTSD.

## 2012-12-29 NOTE — Patient Instructions (Addendum)
Your physician recommends that you schedule a follow-up appointment in:  2 months with Dr. Excell Seltzer

## 2012-12-29 NOTE — Assessment & Plan Note (Addendum)
The patient seems to remain remaining stable at the present time.  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 I will have him seen in followup a couple of months by Dr. Excell Seltzer.  Should he have problems in the interim he is to call us.

## 2012-12-29 NOTE — Assessment & Plan Note (Signed)
Currently off of statin treatment.
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
## 2013-01-03 NOTE — Assessment & Plan Note (Signed)
Well controlled at present

## 2013-01-03 NOTE — Assessment & Plan Note (Signed)
No longer present  

## 2013-01-03 NOTE — Assessment & Plan Note (Signed)
Followed by [facility].

## 2013-01-03 NOTE — Assessment & Plan Note (Signed)
Currently better control.

## 2013-01-03 NOTE — Assessment & Plan Note (Signed)
Ongoing concern.  Began long before cardiac issues.

## 2013-01-19 ENCOUNTER — Telehealth: Payer: Self-pay | Admitting: Cardiovascular Disease

## 2013-01-19 NOTE — Telephone Encounter (Signed)
I spoke with the pt and he went to the [place] last week for an eye exam.  When the pt was walking from the parking lot to the building he became distressed and was taken to the ER at the [facility] for evaluation due to possible heart attack.  The pt did have a cardiac catheterization performed and was told that a major blockage was "removed" and a stent was placed on the "left side". The pt was given a stent card after procedure.  The pt said he stayed in the hospital for a week because his pulse kept dropping into the 30's and his BP was low. I made the pt aware that he will need to obtain all his hospital records from the [place].  The pt also said he blacked out on Saturday when he got up from his chair and fell against a book case.  The pt cut his left arm, wrist and bridge of nose with fall. [facility] this [facility] and they have scheduled the pt to come back to the [place] tomorrow for further evaluation. I made the pt aware that I will contact him on Wednesday to see what occurred at his appointment with the [facility]. The pt will also obtain his records during this appointment.

## 2013-01-19 NOTE — Telephone Encounter (Signed)
New problem    Pt wants you/dr cooper to have information regarding his case

## 2013-01-21 NOTE — Telephone Encounter (Signed)
Pt drove to the [place] and again had difficulty walking from the parking lot to the building.  The pt said he was instructed to increase salt and water intake. They have scheduled the pt to be seen again on Monday for further testing.  The pt would like to hold off on appointment at this time have additional testing.  The pt was once again instructed to obtain his hospital records.

## 2013-01-22 NOTE — Telephone Encounter (Signed)
I will contact the pt next week to check on his progress.

## 2013-01-28 ENCOUNTER — Telehealth: Payer: Self-pay | Admitting: Cardiovascular Disease

## 2013-01-28 NOTE — Telephone Encounter (Signed)
Pt Dropped Off Records From [facility] For Dr.Cooper Review. 01/28/13/KM

## 2013-01-28 NOTE — Telephone Encounter (Signed)
New Prob      Pt calling in wanting to speak to nurse regarding medical files requested. Please call.

## 2013-01-28 NOTE — Telephone Encounter (Signed)
Called patient back. He states that he has a copy of his medical records from the [place] and will bring them to the office soon. Advised him to keep a copy for himself. He mentioned that he was in isolation because he was told that he had MRSA when he was at the [place] but they would not say how he could be treated.He is scheduled for an abdominal CT at the [place] soon. I advised him that he needs to follow up concerning the MRSA diagnosis but that I was not able to call on his behalf. Patient verbalized understanding.

## 2013-02-20 ENCOUNTER — Telehealth: Payer: Self-pay | Admitting: Cardiology

## 2013-02-20 NOTE — Telephone Encounter (Signed)
Called to check on patient status.  He is stable at present.  He will try to get an appointment with Dr. Excell Seltzer.  Reminded him to make sure he takes his platelet drugs.  TS

## 2013-02-28 ENCOUNTER — Observation Stay (HOSPITAL_COMMUNITY): Payer: Medicare Other

## 2013-02-28 ENCOUNTER — Observation Stay (HOSPITAL_COMMUNITY)
Admission: EM | Admit: 2013-02-28 | Discharge: 2013-03-03 | Disposition: A | Discharge status: Home Health | Payer: Medicare Other | Attending: Internal Medicine | Admitting: Internal Medicine

## 2013-02-28 ENCOUNTER — Emergency Department (HOSPITAL_COMMUNITY): Payer: Medicare Other

## 2013-02-28 ENCOUNTER — Encounter (HOSPITAL_COMMUNITY): Payer: Self-pay | Admitting: Emergency Medicine

## 2013-02-28 DIAGNOSIS — R110 Nausea: Secondary | ICD-10-CM | POA: Diagnosis present

## 2013-02-28 DIAGNOSIS — I2510 Atherosclerotic heart disease of native coronary artery without angina pectoris: Secondary | ICD-10-CM | POA: Insufficient documentation

## 2013-02-28 DIAGNOSIS — F259 Schizoaffective disorder, unspecified: Secondary | ICD-10-CM | POA: Insufficient documentation

## 2013-02-28 DIAGNOSIS — E1149 Type 2 diabetes mellitus with other diabetic neurological complication: Secondary | ICD-10-CM | POA: Insufficient documentation

## 2013-02-28 DIAGNOSIS — E1142 Type 2 diabetes mellitus with diabetic polyneuropathy: Secondary | ICD-10-CM | POA: Insufficient documentation

## 2013-02-28 DIAGNOSIS — R0602 Shortness of breath: Secondary | ICD-10-CM | POA: Insufficient documentation

## 2013-02-28 DIAGNOSIS — E781 Pure hyperglyceridemia: Secondary | ICD-10-CM | POA: Insufficient documentation

## 2013-02-28 DIAGNOSIS — Z9861 Coronary angioplasty status: Secondary | ICD-10-CM | POA: Insufficient documentation

## 2013-02-28 DIAGNOSIS — E119 Type 2 diabetes mellitus without complications: Secondary | ICD-10-CM | POA: Diagnosis present

## 2013-02-28 DIAGNOSIS — Z7902 Long term (current) use of antithrombotics/antiplatelets: Secondary | ICD-10-CM | POA: Insufficient documentation

## 2013-02-28 DIAGNOSIS — F4310 Post-traumatic stress disorder, unspecified: Secondary | ICD-10-CM | POA: Diagnosis present

## 2013-02-28 DIAGNOSIS — Z9181 History of falling: Secondary | ICD-10-CM | POA: Insufficient documentation

## 2013-02-28 DIAGNOSIS — Z79899 Other long term (current) drug therapy: Secondary | ICD-10-CM | POA: Insufficient documentation

## 2013-02-28 DIAGNOSIS — R2681 Unsteadiness on feet: Secondary | ICD-10-CM | POA: Diagnosis present

## 2013-02-28 DIAGNOSIS — F29 Unspecified psychosis not due to a substance or known physiological condition: Secondary | ICD-10-CM | POA: Insufficient documentation

## 2013-02-28 DIAGNOSIS — I252 Old myocardial infarction: Secondary | ICD-10-CM | POA: Insufficient documentation

## 2013-02-28 DIAGNOSIS — F05 Delirium due to known physiological condition: Secondary | ICD-10-CM | POA: Diagnosis present

## 2013-02-28 DIAGNOSIS — R1013 Epigastric pain: Principal | ICD-10-CM | POA: Diagnosis present

## 2013-02-28 DIAGNOSIS — Z794 Long term (current) use of insulin: Secondary | ICD-10-CM | POA: Insufficient documentation

## 2013-02-28 DIAGNOSIS — R109 Unspecified abdominal pain: Secondary | ICD-10-CM

## 2013-02-28 DIAGNOSIS — R269 Unspecified abnormalities of gait and mobility: Secondary | ICD-10-CM | POA: Insufficient documentation

## 2013-02-28 DIAGNOSIS — G8929 Other chronic pain: Secondary | ICD-10-CM | POA: Insufficient documentation

## 2013-02-28 HISTORY — DX: Parkinson's disease without dyskinesia, without mention of fluctuations: G20.A1

## 2013-02-28 HISTORY — DX: Parkinson's disease: G20

## 2013-02-28 LAB — URINALYSIS, ROUTINE W REFLEX MICROSCOPIC
Bilirubin Urine: NEGATIVE
Glucose, UA: >1000 mg/dL — AB
Hgb urine dipstick: NEGATIVE
Ketones, ur: NEGATIVE mg/dL
Leukocytes, UA: NEGATIVE
Nitrite: NEGATIVE
Protein, ur: NEGATIVE mg/dL
Specific Gravity, Urine: 1.035 — ABNORMAL HIGH (ref 1.005–1.030)
Urobilinogen, UA: 1 mg/dL (ref 0.0–1.0)
pH: 5.5 (ref 5.0–8.0)

## 2013-02-28 LAB — COMPREHENSIVE METABOLIC PANEL WITH GFR
Albumin: 3.9 g/dL (ref 3.5–5.2)
BUN: 16 mg/dL (ref 6–23)
Creatinine, Ser: 0.88 mg/dL (ref 0.50–1.35)
GFR calc Af Amer: >90 mL/min (ref >90)
Glucose, Bld: 348 mg/dL — ABNORMAL HIGH (ref 70–99)
Total Bilirubin: 0.3 mg/dL (ref 0.3–1.2)
Total Protein: 6.8 g/dL (ref 6.0–8.3)

## 2013-02-28 LAB — CBC WITH DIFFERENTIAL/PLATELET
Basophils Absolute: 0 10*3/uL (ref 0.0–0.1)
Basophils Relative: 0 % (ref 0–1)
Eosinophils Absolute: 0.3 K/uL (ref 0.0–0.7)
Eosinophils Relative: 3 % (ref 0–5)
HCT: 38.5 % — ABNORMAL LOW (ref 39.0–52.0)
Hemoglobin: 13.7 g/dL (ref 13.0–17.0)
Lymphocytes Relative: 16 % (ref 12–46)
Lymphs Abs: 1.6 10*3/uL (ref 0.7–4.0)
MCH: 32 pg (ref 26.0–34.0)
MCHC: 35.6 g/dL (ref 30.0–36.0)
MCV: 90 fL (ref 78.0–100.0)
Monocytes Absolute: 0.8 K/uL (ref 0.1–1.0)
Monocytes Relative: 8 % (ref 3–12)
Neutro Abs: 7.4 10*3/uL (ref 1.7–7.7)
Neutrophils Relative %: 74 % (ref 43–77)
Platelets: 138 10*3/uL — ABNORMAL LOW (ref 150–400)
RBC: 4.28 MIL/uL (ref 4.22–5.81)
RDW: 13.3 % (ref 11.5–15.5)
WBC: 10.1 10*3/uL (ref 4.0–10.5)

## 2013-02-28 LAB — COMPREHENSIVE METABOLIC PANEL
ALT: 23 U/L (ref 0–53)
AST: 23 U/L (ref 0–37)
Alkaline Phosphatase: 77 U/L (ref 39–117)
CO2: 22 mEq/L (ref 19–32)
Calcium: 9.3 mg/dL (ref 8.4–10.5)
Chloride: 102 mEq/L (ref 96–112)
GFR calc non Af Amer: 88 mL/min — ABNORMAL LOW (ref >90)
Potassium: 3.8 mEq/L (ref 3.5–5.1)
Sodium: 136 mEq/L (ref 135–145)

## 2013-02-28 LAB — URINE MICROSCOPIC-ADD ON

## 2013-02-28 LAB — LIPASE, BLOOD: Lipase: 61 U/L — ABNORMAL HIGH (ref 11–59)

## 2013-02-28 LAB — POCT I-STAT TROPONIN I: Troponin i, poc: 0 ng/mL (ref 0.00–0.08)

## 2013-02-28 LAB — PRO B NATRIURETIC PEPTIDE: Pro B Natriuretic peptide (BNP): 24.5 pg/mL (ref 0–125)

## 2013-02-28 LAB — GLUCOSE, CAPILLARY: Glucose-Capillary: 321 mg/dL — ABNORMAL HIGH (ref 70–99)

## 2013-02-28 LAB — TSH: TSH: 1.193 u[IU]/mL (ref 0.350–4.500)

## 2013-02-28 LAB — CG4 I-STAT (LACTIC ACID): Lactic Acid, Venous: 3.1 mmol/L — ABNORMAL HIGH (ref 0.5–2.2)

## 2013-02-28 LAB — CK: Total CK: 196 U/L (ref 7–232)

## 2013-02-28 LAB — HEMOGLOBIN A1C: Hgb A1c MFr Bld: 7.8 % — ABNORMAL HIGH (ref <5.7)

## 2013-02-28 MED ORDER — ONDANSETRON HCL 4 MG/2ML IJ SOLN: 4.0000 mg | Freq: Four times a day (QID) | INTRAMUSCULAR | Status: DC | PRN

## 2013-02-28 MED ORDER — SODIUM CHLORIDE 0.9 % IJ SOLN
3.0000 mL | Freq: Two times a day (BID) | INTRAMUSCULAR | Status: DC
  Administered 2013-03-02 – 2013-03-03 (×2): 3 mL via INTRAVENOUS

## 2013-02-28 MED ORDER — PANTOPRAZOLE SODIUM 40 MG IV SOLR
40.0000 mg | Freq: Once | INTRAVENOUS | Status: AC
  Administered 2013-02-28: 40 mg via INTRAVENOUS
  Filled 2013-02-28: qty 40

## 2013-02-28 MED ORDER — DORZOLAMIDE HCL 2 % OP SOLN
1.0000 [drp] | Freq: Every day | OPHTHALMIC | Status: DC
  Administered 2013-02-28 – 2013-03-02 (×3): 1 [drp] via OPHTHALMIC
  Filled 2013-02-28 (×2): qty 10

## 2013-02-28 MED ORDER — INSULIN ASPART 100 UNIT/ML ~~LOC~~ SOLN
0.0000 [IU] | Freq: Three times a day (TID) | SUBCUTANEOUS | Status: DC
  Administered 2013-02-28: 2 [IU] via SUBCUTANEOUS
  Administered 2013-02-28: 7 [IU] via SUBCUTANEOUS
  Administered 2013-03-01 (×2): 2 [IU] via SUBCUTANEOUS
  Administered 2013-03-01: 3 [IU] via SUBCUTANEOUS
  Administered 2013-03-02: 1 [IU] via SUBCUTANEOUS
  Administered 2013-03-02 – 2013-03-03 (×4): 2 [IU] via SUBCUTANEOUS
  Administered 2013-03-03: 1 [IU] via SUBCUTANEOUS

## 2013-02-28 MED ORDER — ISOSORBIDE MONONITRATE 15 MG HALF TABLET
15.0000 mg | ORAL_TABLET | Freq: Every day | ORAL | Status: DC
  Administered 2013-02-28 – 2013-03-03 (×4): 15 mg via ORAL
  Filled 2013-02-28 (×4): qty 1

## 2013-02-28 MED ORDER — FENTANYL CITRATE 0.05 MG/ML IJ SOLN
50.0000 ug | INTRAMUSCULAR | Status: DC | PRN
  Administered 2013-02-28: 50 ug via INTRAVENOUS
  Filled 2013-02-28: qty 2

## 2013-02-28 MED ORDER — PANTOPRAZOLE SODIUM 40 MG PO TBEC
40.0000 mg | DELAYED_RELEASE_TABLET | Freq: Every day | ORAL | Status: DC
  Administered 2013-03-01 – 2013-03-03 (×3): 40 mg via ORAL
  Filled 2013-02-28 (×3): qty 1

## 2013-02-28 MED ORDER — IOHEXOL 300 MG/ML  SOLN
100.0000 mL | Freq: Once | INTRAMUSCULAR | Status: AC | PRN
  Administered 2013-02-28: 100 mL via INTRAVENOUS

## 2013-02-28 MED ORDER — SERTRALINE HCL 25 MG PO TABS
12.5000 mg | ORAL_TABLET | Freq: Every day | ORAL | Status: DC
  Administered 2013-02-28 – 2013-03-03 (×4): 12.5 mg via ORAL
  Filled 2013-02-28 (×4): qty 0.5

## 2013-02-28 MED ORDER — ONDANSETRON HCL 4 MG PO TABS: 4.0000 mg | ORAL_TABLET | Freq: Four times a day (QID) | ORAL | Status: DC | PRN

## 2013-02-28 MED ORDER — TERAZOSIN HCL 5 MG PO CAPS
5.0000 mg | ORAL_CAPSULE | Freq: Every day | ORAL | Status: DC
Start: 2013-02-28 — End: 2013-03-03
  Administered 2013-02-28 – 2013-03-02 (×3): 5 mg via ORAL
  Filled 2013-02-28 (×5): qty 1

## 2013-02-28 MED ORDER — CARBOXYMETHYLCELLULOSE SODIUM 1 % OP SOLN: 1.0000 [drp] | Freq: Two times a day (BID) | OPHTHALMIC | Status: DC | PRN

## 2013-02-28 MED ORDER — ONDANSETRON HCL 4 MG/2ML IJ SOLN
4.0000 mg | Freq: Once | INTRAMUSCULAR | Status: AC
  Administered 2013-02-28: 4 mg via INTRAVENOUS
  Filled 2013-02-28: qty 2

## 2013-02-28 MED ORDER — LATANOPROST 0.005 % OP SOLN
1.0000 [drp] | Freq: Every day | OPHTHALMIC | Status: DC
  Administered 2013-02-28 – 2013-03-02 (×3): 1 [drp] via OPHTHALMIC
  Filled 2013-02-28 (×2): qty 2.5

## 2013-02-28 MED ORDER — SODIUM CHLORIDE 0.9 % IV SOLN
INTRAVENOUS | Status: DC
  Administered 2013-02-28 – 2013-03-01 (×2): 100 mL/h via INTRAVENOUS
  Administered 2013-03-01 – 2013-03-02 (×2): via INTRAVENOUS

## 2013-02-28 MED ORDER — ASPIRIN 81 MG PO CHEW
81.0000 mg | CHEWABLE_TABLET | Freq: Every day | ORAL | Status: DC
  Administered 2013-02-28 – 2013-03-03 (×4): 81 mg via ORAL
  Filled 2013-02-28 (×4): qty 1

## 2013-02-28 MED ORDER — GABAPENTIN 600 MG PO TABS
600.0000 mg | ORAL_TABLET | Freq: Every day | ORAL | Status: DC
  Administered 2013-02-28 – 2013-03-02 (×3): 600 mg via ORAL
  Filled 2013-02-28 (×4): qty 1

## 2013-02-28 MED ORDER — CLOPIDOGREL BISULFATE 75 MG PO TABS
75.0000 mg | ORAL_TABLET | Freq: Every day | ORAL | Status: DC
  Administered 2013-02-28 – 2013-03-03 (×4): 75 mg via ORAL
  Filled 2013-02-28 (×4): qty 1

## 2013-02-28 MED ORDER — IOHEXOL 300 MG/ML  SOLN
20.0000 mL | INTRAMUSCULAR | Status: DC
  Administered 2013-02-28: 50 mL via ORAL

## 2013-02-28 MED ORDER — ENOXAPARIN SODIUM 40 MG/0.4ML ~~LOC~~ SOLN
40.0000 mg | SUBCUTANEOUS | Status: DC
  Administered 2013-02-28 – 2013-03-03 (×4): 40 mg via SUBCUTANEOUS
  Filled 2013-02-28 (×4): qty 0.4

## 2013-02-28 MED ORDER — GI COCKTAIL ~~LOC~~
30.0000 mL | Freq: Three times a day (TID) | ORAL | Status: DC | PRN
  Administered 2013-02-28: 30 mL via ORAL
  Filled 2013-02-28: qty 30

## 2013-02-28 MED ORDER — POLYVINYL ALCOHOL 1.4 % OP SOLN
1.0000 [drp] | Freq: Two times a day (BID) | OPHTHALMIC | Status: DC | PRN
  Filled 2013-02-28: qty 15

## 2013-02-28 NOTE — ED Notes (Signed)
Dr Oletta Lamas notified of Lac acid level

## 2013-02-28 NOTE — ED Notes (Signed)
Admitting at the bedside.  

## 2013-02-28 NOTE — ED Notes (Signed)
Report received from [facility]> pt c/o abd cramping while at home that radiates into chest.  History of MI x 2 and cardiac stents.  EMS administered ASA 324 mg and 1 NTG SL pta.  Pt rates pain 8/10.  C/o sob and nausea.  CBG 246.
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
## 2013-02-28 NOTE — ED Notes (Signed)
Pt finished with po contrast.  Ct called. Will come for pt.

## 2013-02-28 NOTE — ED Notes (Signed)
Floor unable to get report at the time. Nurse to call back.
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
## 2013-02-28 NOTE — ED Notes (Signed)
Attempted to call report x2, nurse to call back.

## 2013-03-01 DIAGNOSIS — R109 Unspecified abdominal pain: Secondary | ICD-10-CM

## 2013-03-01 LAB — CBC
HCT: 40.9 % (ref 39.0–52.0)
MCH: 31.4 pg (ref 26.0–34.0)
MCV: 90.5 fL (ref 78.0–100.0)
RDW: 13.1 % (ref 11.5–15.5)
WBC: 6.3 10*3/uL (ref 4.0–10.5)

## 2013-03-01 LAB — COMPREHENSIVE METABOLIC PANEL
ALT: 21 U/L (ref 0–53)
AST: 22 U/L (ref 0–37)
Albumin: 3.6 g/dL (ref 3.5–5.2)
Alkaline Phosphatase: 73 U/L (ref 39–117)
BUN: 10 mg/dL (ref 6–23)
Potassium: 3.8 mEq/L (ref 3.5–5.1)
Sodium: 140 mEq/L (ref 135–145)
Total Protein: 6.4 g/dL (ref 6.0–8.3)

## 2013-03-01 LAB — GLUCOSE, CAPILLARY
Glucose-Capillary: 152 mg/dL — ABNORMAL HIGH (ref 70–99)
Glucose-Capillary: 209 mg/dL — ABNORMAL HIGH (ref 70–99)

## 2013-03-01 MED ORDER — ACETAMINOPHEN 325 MG PO TABS
650.0000 mg | ORAL_TABLET | Freq: Four times a day (QID) | ORAL | Status: DC | PRN
  Administered 2013-03-01 – 2013-03-03 (×3): 650 mg via ORAL
  Filled 2013-03-01 (×3): qty 2

## 2013-03-01 MED ORDER — PANTOPRAZOLE SODIUM 40 MG PO TBEC: 40.0000 mg | DELAYED_RELEASE_TABLET | Freq: Every day | ORAL | Status: AC

## 2013-03-01 MED ORDER — SUCRALFATE 1 GM/10ML PO SUSP
1.0000 g | Freq: Once | ORAL | Status: AC
  Administered 2013-03-01: 1 g via ORAL
  Filled 2013-03-01: qty 10

## 2013-03-01 MED ORDER — GABAPENTIN 600 MG PO TABS: 600.0000 mg | ORAL_TABLET | Freq: Every day | ORAL | Status: DC

## 2013-03-01 NOTE — Progress Notes (Signed)
Subjective:    Patient much more interactive this morning. He states his abdominal pain has resolved. He denies any nausea or vomiting. He states he has baseline confusion and memory loss and cannot remember anything that occurred in the days prior to admission.   Interval Events: No acute events.    Objective:    Vital Signs:   Temp:  [97.3 F (36.3 C)-98.4 F (36.9 C)] 98 F (36.7 C) (06/15 0500) Pulse Rate:  [51-66] 66 (06/15 0500) Resp:  [18-20] 20 (06/15 0500) BP: (118-144)/(62-73) 121/72 mmHg (06/15 0500) SpO2:  [97 %-100 %] 98 % (06/15 0500) Weight:  [183 lb (83.008 kg)] 183 lb (83.008 kg) (06/15 0500) Last BM Date: 02/28/13  24-hour weight change: Weight change:   Intake/Output:   Intake/Output Summary (Last 24 hours) at 03/01/13 1135 Last data filed at 03/01/13 0900  Gross per 24 hour  Intake   1170 ml  Output   1751 ml  Net   -581 ml      Physical Exam: General: Vital signs reviewed and noted. Well-developed, well-nourished, in no acute distress; alert, appropriate and cooperative throughout examination.  Lungs:  Normal respiratory effort. Minimal L basilar rales.  Heart: RRR. S1 and S2 normal without gallop, murmur, or rubs.  Abdomen:  BS normoactive. Soft, Nondistended, non-tender.  No masses or organomegaly.  Extremities: No pretibial edema.     Labs:  Basic Metabolic Panel:  Recent Labs Lab 02/28/13 0316 03/01/13 0510  NA 136 140  K 3.8 3.8  CL 102 106  CO2 22 27  GLUCOSE 348* 137*  BUN 16 10  CREATININE 0.88 0.92  CALCIUM 9.3 9.2    Liver Function Tests:  Recent Labs Lab 02/28/13 0316 03/01/13 0510  AST 23 22  ALT 23 21  ALKPHOS 77 73  BILITOT 0.3 0.4  PROT 6.8 6.4  ALBUMIN 3.9 3.6    Recent Labs Lab 02/28/13 0316  LIPASE 61*    CBC:  Recent Labs Lab 02/28/13 0316 03/01/13 0510  WBC 10.1 6.3  NEUTROABS 7.4  --   HGB 13.7 14.2  HCT 38.5* 40.9  MCV 90.0 90.5  PLT 138* 124*    Cardiac Enzymes:  Recent  Labs Lab 02/28/13 0723 02/28/13 1250 02/28/13 1728  CKTOTAL  --  196  --   TROPONINI <0.30 <0.30 <0.30    CBG:  Recent Labs Lab 02/28/13 1150 02/28/13 1759 02/28/13 2304 03/01/13 0732  GLUCAP 321* 179* 133* 152*    Microbiology: Results for orders placed during the hospital encounter of 03/02/12  MRSA PCR SCREENING     Status: None   Collection Time    03/02/12  3:18 PM      Result Value Range Status   MRSA by PCR NEGATIVE  NEGATIVE Final   Comment:            The GeneXpert MRSA Assay (FDA     approved for NASAL specimens     only), is one component of a     comprehensive MRSA colonization     surveillance program. It is not     intended to diagnose MRSA     infection nor to guide or     monitor treatment for     MRSA infections.    Imaging: X-ray Chest Pa And Lateral   02/28/2013   *RADIOLOGY REPORT*  Clinical Data: Chest pain with inspiration.  CHEST - 2 VIEW  Comparison: Earlier today.  Findings: Normal sized heart.  Clear lungs.  A poor inspiration is again demonstrated.  Mild central peribronchial thickening. Minimal scoliosis.  IMPRESSION: Mild bronchitic changes.   Original Report Authenticated By: Beckie Salts, M.D.   Ct Abdomen Pelvis W Contrast  02/28/2013   *RADIOLOGY REPORT*  Clinical Data: Abdominal pain with nausea and vomiting.  CT ABDOMEN AND PELVIS WITH CONTRAST  Technique:  Multidetector CT imaging of the abdomen and pelvis was performed following the standard protocol during bolus administration of intravenous contrast.  Contrast: OMNIPAQUE IOHEXOL 300 MG/ML  SOLN  Comparison: None.  Findings: There is mild bibasilar atelectasis.  No evidence for pneumoperitoneum.  Evidence for the right coronary artery stent and/or calcification. Low density of the liver suggests steatosis.  The gallbladder has been removed.  Normal appearance of the spleen, pancreas, adrenal glands and kidneys. There is a 3 mm calcification in the left kidney upper pole that  probably representing a nonobstructive kidney stone.  In addition, there appears to be small left renal cysts.  No significant free fluid or lymphadenopathy  The prostate is prominent for size, measuring 6.3 cm in the transverse dimension. In addition, there appears to be nodular indentation along the bladder base from the prostate.  Normal appearance of the small and large bowel.  Normal appearance of the appendix.  Atherosclerotic calcifications in the aorta without aneurysmal dilatation.  Portal venous system is patent. Degenerative facet changes in the lower lumbar spine.  There is marked space disease at T12-L1.  Disc space disease at  L3-L4 and L4-L5.  Mild anterolisthesis at L3-L4 appears to be related to degenerative facet changes.  IMPRESSION: No acute abnormalities in the abdomen or pelvis.  Nonobstructive left kidney stone.  Prostate hypertrophy.  Degenerative disease in the lumbar spine.  Probable hepatic steatosis.   Original Report Authenticated By: Richarda Overlie, M.D.   Dg Chest Port 1 View  02/28/2013   *RADIOLOGY REPORT*  Clinical Data: Chest pain, shortness of breath.  PORTABLE CHEST - 1 VIEW  Comparison: 10/09/2012  Findings: Low lung volumes.  Patchy atelectasis versus early infiltrates in the lung bases, left greater than right.  No effusion.  Heart size normal.  Regional bones unremarkable. Vascular clips in the right upper abdomen.  IMPRESSION:  1.  Low volumes with bibasilar atelectasis or infiltrates, left greater than right.   Original Report Authenticated By: D. Andria Rhein, MD       Medications:    Infusions: . sodium chloride 100 mL/hr (03/01/13 0953)    Scheduled Medications: . aspirin  81 mg Oral Daily  . clopidogrel  75 mg Oral Daily  . dorzolamide  1 drop Both Eyes QHS  . enoxaparin (LOVENOX) injection  40 mg Subcutaneous Q24H  . gabapentin  600 mg Oral Daily  . insulin aspart  0-9 Units Subcutaneous TID WC  . isosorbide mononitrate  15 mg Oral Daily  . latanoprost   1 drop Left Eye QHS  . pantoprazole  40 mg Oral Daily  . sertraline  12.5 mg Oral Daily  . sodium chloride  3 mL Intravenous Q12H  . terazosin  5 mg Oral QHS    PRN Medications: gi cocktail, ondansetron (ZOFRAN) IV, ondansetron, polyvinyl alcohol   Assessment/ Plan:   Abdominal pain - resolved. Workup has been negative and it seems that his discomfort is most likely secondary to GERD given his marked improvement in symptoms with protonix. See discharge summary (currently pending) for details. - discharge home on protonix 40mg  daily  Schizoaffective disorder - appears to be at baseline.  Most likely secondary to the patient's very active and currently unmedicated schizoaffective disorder. See discharge summary for details regarding this issue.    PTSD - pt fought in the [place] War and has suffered from PTSD. He follows with the [facility] for this issue.   Dispo - pt states he is not able to regularly attend appointments with his PCP at the [place] due to multiple issues including lack of transportation. He states he is not able to be scheduled to see a psychiatrist at the [facility] for more frequently than once every 1-2 years. For these reasons, the patient appears to need to establish with a new and local PCP in order to have sufficient care of his multiple active medical and psychiatric issues. He was instructed to contact [place] primary care to establish with a PCP, as he is already being seen by [facility]. He will need referral to psychiatry in the near future as well.     DISPO - Discharge home today.   The patient does not have a current PCP (Pcp Not In System) and does not need an [facility] hospital follow-up appointment after discharge.    Is the [facility] hospital follow-up appointment a one-time only appointment? not applicable.  Does the patient have transportation limitations that hinder transportation to clinic appointments? yes   SERVICE NEEDED AT DISCHARGE - TO BE DETERMINED DURING  HOSPITAL COURSE         Y = Yes, Blank = No PT:   OT:   RN:   Equipment:   Other:      Length of Stay: 1 day(s)   Signed: Elfredia Nevins, MD  PGY-1, Internal Medicine Resident Pager: [phone] (7AM-5PM) 03/01/2013, 11:35 AM

## 2013-03-01 NOTE — Progress Notes (Addendum)
Called to room, pt c/o of mid to left sided upper chest pain that feels like needles. B/p 133/76 HR 65 SAO2 97% on R/A pt c/o of SOB however, RR 20 with no dyspnea noted. T/C to MD to inform. Pt reports he ate lunch and then laid back to relax. Protonix dose given today. Also noted this pain was the same as last night's pain that resolved with the carafate dose

## 2013-03-01 NOTE — H&P (Signed)
Date: 03/01/2013  Patient name: Bruce [name]  Medical record number: [id number]  Date of birth: [birth year]   I have seen and evaluated Bruce [name] and discussed their care with the Residency Team.  I saw Bruce [name] with Bruce Bruce [name]. He is much better today and is at his baseline. He has no recollection of the 13th. He remembers pain all over, hearing muffled voices, couldn't see anything except someone in ambulance, once got to room, he was at baseline. He can provide no other details about current reasons for hospitalization.   His medical issues have been "snowballing" although he cannot state over how long. The issues include pain all over inc peripheral neuropathy and tooth pain from caries and a fractured tooth, short term memory loss, easily agitated, syncope, and poor balance. Of note, someone at [facility] started him on Gabapentin that has helped tremendously with his neuropathy.  He loves alone but sometimes his niece stays with him but she is unreliable. He lost his license but still drives as that is the only way he can get to the [place]. He has tried to get transportation via the [place] system but turned down. Of note, recently approved for [employer]. Bruce [name] will be new card at [facility].  Physical Exam: Blood pressure 121/72, pulse 66, temperature 98 F (36.7 C), temperature source Oral, resp. rate 20, weight 183 lb (83.008 kg), SpO2 98.00%. General appearance: alert, cooperative, appears stated age and no distress Head: Normocephalic, without obvious abnormality, atraumatic Eyes: Clear, EOMI Lungs: clear to auscultation bilaterally Heart: regular rate and rhythm, S1, S2 normal, no murmur, click, rub or gallop Abdomen: soft, non-tender; bowel sounds normal; no masses,  no organomegaly Extremities: extremities normal, atraumatic, no cyanosis or edema Pulses: 2+ and symmetric Skin: Skin color, texture, turgor normal. No rashes or lesions or multiple tattoos Neurologic: Grossly normal. Moves all  4. Interactive. Answers ? appropriately.   Lab results: Results for orders placed during the hospital encounter of 02/28/13 (from the past 24 hour(s))  HEMOGLOBIN A1C     Status: Abnormal   Collection Time    02/28/13 12:50 PM      Result Value Range   Hemoglobin A1C 7.8 (*) <5.7 %   Mean Plasma Glucose 177 (*) <117 mg/dL  TROPONIN I     Status: None   Collection Time    02/28/13 12:50 PM      Result Value Range   Troponin I <0.30  <0.30 ng/mL  HIV ANTIBODY (ROUTINE TESTING)     Status: None   Collection Time    02/28/13 12:50 PM      Result Value Range   HIV NON REACTIVE  NON REACTIVE  TSH     Status: None   Collection Time    02/28/13 12:50 PM      Result Value Range   TSH 1.193  0.350 - 4.500 uIU/mL  CK     Status: None   Collection Time    02/28/13 12:50 PM      Result Value Range   Total CK 196  7 - 232 U/L  TROPONIN I     Status: None   Collection Time    02/28/13  5:28 PM      Result Value Range   Troponin I <0.30  <0.30 ng/mL  GLUCOSE, CAPILLARY     Status: Abnormal   Collection Time    02/28/13  5:59 PM      Result Value Range   Glucose-Capillary 179 (*)  70 - 99 mg/dL  GLUCOSE, CAPILLARY     Status: Abnormal   Collection Time    02/28/13 11:04 PM      Result Value Range   Glucose-Capillary 133 (*) 70 - 99 mg/dL  OCCULT BLOOD X 1 CARD TO LAB, STOOL     Status: None   Collection Time    03/01/13  4:46 AM      Result Value Range   Fecal Occult Bld NEGATIVE  NEGATIVE  COMPREHENSIVE METABOLIC PANEL     Status: Abnormal   Collection Time    03/01/13  5:10 AM      Result Value Range   Sodium 140  135 - 145 mEq/L   Potassium 3.8  3.5 - 5.1 mEq/L   Chloride 106  96 - 112 mEq/L   CO2 27  19 - 32 mEq/L   Glucose, Bld 137 (*) 70 - 99 mg/dL   BUN 10  6 - 23 mg/dL   Creatinine, Ser 1.61  0.50 - 1.35 mg/dL   Calcium 9.2  8.4 - 09.6 mg/dL   Total Protein 6.4  6.0 - 8.3 g/dL   Albumin 3.6  3.5 - 5.2 g/dL   AST 22  0 - 37 U/L   ALT 21  0 - 53 U/L   Alkaline  Phosphatase 73  39 - 117 U/L   Total Bilirubin 0.4  0.3 - 1.2 mg/dL   GFR calc non Af Amer 86 (*) >90 mL/min   GFR calc Af Amer >90  >90 mL/min  CBC     Status: Abnormal   Collection Time    03/01/13  5:10 AM      Result Value Range   WBC 6.3  4.0 - 10.5 K/uL   RBC 4.52  4.22 - 5.81 MIL/uL   Hemoglobin 14.2  13.0 - 17.0 g/dL   HCT 04.5  40.9 - 81.1 %   MCV 90.5  78.0 - 100.0 fL   MCH 31.4  26.0 - 34.0 pg   MCHC 34.7  30.0 - 36.0 g/dL   RDW 91.4  78.2 - 95.6 %   Platelets 124 (*) 150 - 400 K/uL  GLUCOSE, CAPILLARY     Status: Abnormal   Collection Time    03/01/13  7:32 AM      Result Value Range   Glucose-Capillary 152 (*) 70 - 99 mg/dL   Comment 1 Notify RN      Imaging results:  X-ray Chest Pa And Lateral   02/28/2013   *RADIOLOGY REPORT*  Clinical Data: Chest pain with inspiration.  CHEST - 2 VIEW  Comparison: Earlier today.  Findings: Normal sized heart.  Clear lungs.  A poor inspiration is again demonstrated.  Mild central peribronchial thickening. Minimal scoliosis.  IMPRESSION: Mild bronchitic changes.   Original Report Authenticated By: Beckie Salts, M.D.   Ct Abdomen Pelvis W Contrast  02/28/2013   *RADIOLOGY REPORT*  Clinical Data: Abdominal pain with nausea and vomiting.  CT ABDOMEN AND PELVIS WITH CONTRAST  Technique:  Multidetector CT imaging of the abdomen and pelvis was performed following the standard protocol during bolus administration of intravenous contrast.  Contrast: OMNIPAQUE IOHEXOL 300 MG/ML  SOLN  Comparison: None.  Findings: There is mild bibasilar atelectasis.  No evidence for pneumoperitoneum.  Evidence for the right coronary artery stent and/or calcification. Low density of the liver suggests steatosis.  The gallbladder has been removed.  Normal appearance of the spleen, pancreas, adrenal glands and kidneys. There is a  3 mm calcification in the left kidney upper pole that probably representing a nonobstructive kidney stone.  In addition, there appears  to be small left renal cysts.  No significant free fluid or lymphadenopathy  The prostate is prominent for size, measuring 6.3 cm in the transverse dimension. In addition, there appears to be nodular indentation along the bladder base from the prostate.  Normal appearance of the small and large bowel.  Normal appearance of the appendix.  Atherosclerotic calcifications in the aorta without aneurysmal dilatation.  Portal venous system is patent. Degenerative facet changes in the lower lumbar spine.  There is marked space disease at T12-L1.  Disc space disease at  L3-L4 and L4-L5.  Mild anterolisthesis at L3-L4 appears to be related to degenerative facet changes.  IMPRESSION: No acute abnormalities in the abdomen or pelvis.  Nonobstructive left kidney stone.  Prostate hypertrophy.  Degenerative disease in the lumbar spine.  Probable hepatic steatosis.   Original Report Authenticated By: Richarda Overlie, M.D.   Dg Chest Port 1 View  02/28/2013   *RADIOLOGY REPORT*  Clinical Data: Chest pain, shortness of breath.  PORTABLE CHEST - 1 VIEW  Comparison: 10/09/2012  Findings: Low lung volumes.  Patchy atelectasis versus early infiltrates in the lung bases, left greater than right.  No effusion.  Heart size normal.  Regional bones unremarkable. Vascular clips in the right upper abdomen.  IMPRESSION:  1.  Low volumes with bibasilar atelectasis or infiltrates, left greater than right.   Original Report Authenticated By: D. Andria Rhein, MD    Assessment and Plan: I have seen and evaluated the patient as outlined above. I agree with the formulated Assessment and Plan as detailed in the residents' admission note, with the following changes:   1. Encephalopathy - present on admission but has since resolved. Cause unknown but resolved without intervention. No infxn noted. Trop I negative x 3. No further intervention needed.  2. Chronic pain - it seems as if the gaba has helped a lot and will provide refill of it on D/C.  Otherwise, will need outpt F/U.  3. Syncope and unsteady gait - etiology uncertain but can be pursued as outpt. PT / OT consult. No anemia so doubt B 12 def. TSH is nl.   4. Outpt assistance - pt unhappy with care at [facility] and has transportation issues. Will try to set up with [facility] since already sees [facility]. Will ask [facility] to assess pt. I think he will thrive with just a little bit of help.   Burns Spain, MD 6/15/201412:07 PM

## 2013-03-02 ENCOUNTER — Other Ambulatory Visit: Payer: Self-pay | Admitting: *Deleted

## 2013-03-02 DIAGNOSIS — I2510 Atherosclerotic heart disease of native coronary artery without angina pectoris: Secondary | ICD-10-CM

## 2013-03-02 DIAGNOSIS — E1142 Type 2 diabetes mellitus with diabetic polyneuropathy: Secondary | ICD-10-CM

## 2013-03-02 DIAGNOSIS — R1013 Epigastric pain: Principal | ICD-10-CM

## 2013-03-02 DIAGNOSIS — R269 Unspecified abnormalities of gait and mobility: Secondary | ICD-10-CM

## 2013-03-02 DIAGNOSIS — E1149 Type 2 diabetes mellitus with other diabetic neurological complication: Secondary | ICD-10-CM

## 2013-03-02 DIAGNOSIS — Z9181 History of falling: Secondary | ICD-10-CM

## 2013-03-02 LAB — GLUCOSE, CAPILLARY
Glucose-Capillary: 177 mg/dL — ABNORMAL HIGH (ref 70–99)
Glucose-Capillary: 127 mg/dL — ABNORMAL HIGH (ref 70–99)
Glucose-Capillary: 154 mg/dL — ABNORMAL HIGH (ref 70–99)

## 2013-03-02 MED ORDER — SUCRALFATE 1 G PO TABS
1.0000 g | ORAL_TABLET | Freq: Once | ORAL | Status: AC
  Administered 2013-03-02: 1 g via ORAL
  Filled 2013-03-02: qty 1

## 2013-03-02 NOTE — Progress Notes (Addendum)
Subjective:    Bruce [name] was seen and examined at bedside.  He is complaining of epigastric pain this morning and chronic back and diffuse body pain this morning.  He denies any chest pain, N/V/D, fever, chills, or dysuria at this time.      Objective:    Vital Signs:   Temp:  [97.9 F (36.6 C)-98.3 F (36.8 C)] 98.2 F (36.8 C) (06/16 0527) Pulse Rate:  [55-63] 61 (06/16 0527) Resp:  [16-20] 16 (06/16 0527) BP: (120-173)/(61-87) 130/74 mmHg (06/16 0527) SpO2:  [96 %-98 %] 96 % (06/16 0527) Weight:  [181 lb (82.1 kg)] 181 lb (82.1 kg) (06/16 0527) Last BM Date: 03/01/13  24-hour weight change: Weight change: -2 lb (-0.908 kg)  Intake/Output:   Intake/Output Summary (Last 24 hours) at 03/02/13 1038 Last data filed at 03/02/13 0600  Gross per 24 hour  Intake   2180 ml  Output   3551 ml  Net  -1371 ml     Physical Exam: General: Vital signs reviewed and noted. Well-developed, well-nourished,acute distress due to abdominal pain; alert, appropriate and cooperative throughout examination.  Lungs:  CTA B/L  Heart: RRR. S1 and S2 normal without gallop, murmur, or rubs.  Abdomen:  BS normoactive. Soft, Nondistended, tenderness to palpation of epigastric region.   Extremities: No pretibial edema.    Labs:  Basic Metabolic Panel:  Recent Labs Lab 02/28/13 0316 03/01/13 0510  NA 136 140  K 3.8 3.8  CL 102 106  CO2 22 27  GLUCOSE 348* 137*  BUN 16 10  CREATININE 0.88 0.92  CALCIUM 9.3 9.2    Liver Function Tests:  Recent Labs Lab 02/28/13 0316 03/01/13 0510  AST 23 22  ALT 23 21  ALKPHOS 77 73  BILITOT 0.3 0.4  PROT 6.8 6.4  ALBUMIN 3.9 3.6    Recent Labs Lab 02/28/13 0316  LIPASE 61*    CBC:  Recent Labs Lab 02/28/13 0316 03/01/13 0510  WBC 10.1 6.3  NEUTROABS 7.4  --   HGB 13.7 14.2  HCT 38.5* 40.9  MCV 90.0 90.5  PLT 138* 124*    Cardiac Enzymes:  Recent Labs Lab 02/28/13 0723 02/28/13 1250 02/28/13 1728  CKTOTAL  --  196   --   TROPONINI <0.30 <0.30 <0.30    CBG:  Recent Labs Lab 03/01/13 0732 03/01/13 1130 03/01/13 1645 03/01/13 2114 03/02/13 0751  GLUCAP 152* 174* 209* 163* 177*   Microbiology: Results for orders placed during the hospital encounter of 03/02/12  MRSA PCR SCREENING     Status: None   Collection Time    03/02/12  3:18 PM      Result Value Range Status   MRSA by PCR NEGATIVE  NEGATIVE Final   Comment:            The GeneXpert MRSA Assay (FDA     approved for NASAL specimens     only), is one component of a     comprehensive MRSA colonization     surveillance program. It is not     intended to diagnose MRSA     infection nor to guide or     monitor treatment for     MRSA infections.   Imaging: X-ray Chest Pa And Lateral   02/28/2013   *RADIOLOGY REPORT*  Clinical Data: Chest pain with inspiration.  CHEST - 2 VIEW  Comparison: Earlier today.  Findings: Normal sized heart.  Clear lungs.  A poor inspiration is again demonstrated.  Mild central peribronchial thickening. Minimal scoliosis.  IMPRESSION: Mild bronchitic changes.   Original Report Authenticated By: Beckie Salts, M.D.     Medications:    Infusions: . sodium chloride 100 mL/hr at 03/02/13 0525   Scheduled Medications: . aspirin  81 mg Oral Daily  . clopidogrel  75 mg Oral Daily  . dorzolamide  1 drop Both Eyes QHS  . enoxaparin (LOVENOX) injection  40 mg Subcutaneous Q24H  . gabapentin  600 mg Oral Daily  . insulin aspart  0-9 Units Subcutaneous TID WC  . isosorbide mononitrate  15 mg Oral Daily  . latanoprost  1 drop Left Eye QHS  . pantoprazole  40 mg Oral Daily  . sertraline  12.5 mg Oral Daily  . sodium chloride  3 mL Intravenous Q12H  . terazosin  5 mg Oral QHS   PRN Medications: acetaminophen, gi cocktail, ondansetron (ZOFRAN) IV, ondansetron, polyvinyl alcohol  Assessment/ Plan:   Bruce [name] is a 67 year old male with PMH of DM2, HL, and PTSD presenting with epigastric abdominal pain.  Lipase  mildly elevated 61, CT abdomen showed non-obstructive left kidney stone and prostate hypertrophy.  Likely secondary to gastritis or dyspepsia.    Abdominal pain - mainly in epigastric region. Workup has been negative and it seems that his discomfort is most likely secondary to GERD given improvement in symptoms with protonix.  -gi cocktail prn -protonix 40mg  qd  CAD - previous inferior STEMI in 02/2012 with 2 DES followed by a subsequent STEMI in 03/2012 associated with stent thrombosis after which he underwent PTCA without stenting. Repeat cath in 10/2012 which did not reveal any significant changes in his CAD. On ASA and Plavix at home.  Chest pain on admission resolved, troponin x3 negative.  He follows with Dr. Riley Kill of [facility] and will continue follow up with Dr. Excell Seltzer.  - cont ASA  - cont imdur  - cont plavix   Schizoaffective disorder - appears to be at baseline. Most likely secondary to the patient's very active and currently unmedicated schizoaffective disorder.  -will need to follow up with psychiatry as an outpatient.  Follows with [facility].    PTSD -[place] [employer] veteran. - follows with [facility] for this issue.   Unsteady gait--reported syncope and falls at home, does not appear acute per patient. Was on meclizine per prior cardiology note.  No episodes during hospital course, but noted to have unsteady gait per PT needing rehab facility discharge.  Unknown etiology. No anemia and TSH wnl.  ?autonomic dysfunction given diabetes and hypertension that could be contributing.   -f/u PT/OT--recommend rehab facility for d/c, not safe for home d/c  Chronic pain--back and "hurts all over".  Claims gabapentin works for him.   -continue gabapentin at discharge -follow up with PCP  Dispo - d/c to rehab per PT.  He states he is not able to regularly attend appointments with his PCP at the [place] due to multiple issues including lack of transportation. He states he is not able to be scheduled to  see a psychiatrist at the [facility] for more frequently than once every 1-2 years. For these reasons, the patient appears to need to establish with a new and local PCP in order to have sufficient care of his multiple active medical and psychiatric issues. He wishes to follow with [place] primary care to establish with a PCP, as he is already being seen by [facility]. He has their contact information and will need to schedule follow  up.  He will need referral to psychiatry in the near future as well.    The patient does not have a current PCP (Pcp Not In System) and does not need an [facility] hospital follow-up appointment after discharge  Does the patient have transportation limitations that hinder transportation to clinic appointments? yes          Y = Yes, Blank = No PT: Rehab facility  OT:   RN:   Equipment:   Other:     Length of Stay: 2 day(s)  Signed: Darden Palmer, MD  PGY-1, Internal Medicine Resident Pager: [phone] (7AM-5PM) 03/02/2013, 10:38 AM

## 2013-03-02 NOTE — Care Management Note (Addendum)
    Page 1 of 2   03/03/2013     3:48:10 PM   CARE MANAGEMENT NOTE 03/03/2013  Patient:  Bruce [name], Bruce [name]   Account Number:  [id number]  Date Initiated:  03/02/2013  Documentation initiated by:  GRAVES-BIGELOW,Minnetta Sandora  Subjective/Objective Assessment:   Pt admitted for epigastric pain. Pt has hx of diabetes and suffers from PTSD. Working with PT now. Pt has been previously followed by the [place]. Pt had transportation issues.     Action/Plan:   [facility] has been consulted- can be possible candidate once an appointment has been made with [facility]- office can make referral to [facility]. CM will monitor for additional d/c needs.   Anticipated DC Date:  03/02/2013   Anticipated DC Plan:  HOME W HOME HEALTH SERVICES      DC Planning Services  CM consult      [facility] Choice  HOME HEALTH   Choice offered to / List presented to:  C-1 Patient        HH arranged  HH-1 RN  HH-10 DISEASE MANAGEMENT  HH-2 PT  HH-3 OT  HH-4 NURSE'S AIDE  HH-6 SOCIAL WORKER      HH agency  [facility]   Status of service:  Completed, signed off Medicare Important Message given?   (If response is "NO", the following Medicare IM given date fields will be blank) Date Medicare IM given:   Date Additional Medicare IM given:    Discharge Disposition:  HOME W HOME HEALTH SERVICES  Per UR Regulation:  Reviewed for med. necessity/level of care/duration of stay  If discussed at Long Length of Stay Meetings, dates discussed:    Comments:  03-02-13 1536 Tomi Bamberger, RN,BSN [phone] CSW spke to son in [place] and he is trying to get his dad back to [place]. Pt only has one niece here and she is not too reliable per pt and his son. Pt chose [facility] services for RN, PT, OT, Aide and SW. The SW will help with resources in the community and hopefully transportation to and from the [place].  [facility]ev[facility] Rep Corrie Dandy stated they can get him connected with the ACT Team which is an Adult [employer]. CM had  placed several calls to the [place] and no return phone calls back. NO further needs from CM at this time.

## 2013-03-02 NOTE — Progress Notes (Signed)
Received referral for [facility] Care Management services. Mr Chiara does not have a [facility] PCP. It appears he goes to the [place]. Patient is not eligible for [facility] Care Management services at this time. Paged placed to MD to make aware. Raiford Noble, MSN-ED, RN,BSN- [facility] [phone]

## 2013-03-02 NOTE — Progress Notes (Signed)
Clinical Social Work Department BRIEF PSYCHOSOCIAL ASSESSMENT 03/02/2013  Patient:  Bruce [name], Bruce [name]     Account Number:  [id number]     Admit date:  02/28/2013  Clinical Social Worker:  Lourdes Sledge  Date/Time:  03/02/2013 04:36 PM  Referred by:  Physician  Date Referred:  03/02/2013 Referred for  SNF Placement   Other Referral:   Interview type:  Patient Other interview type:    PSYCHOSOCIAL DATA Living Status:  ALONE Admitted from facility:   Level of care:   Primary support name:  Bruce [name] [phone] Primary support relationship to patient:  CHILD, ADULT Degree of support available:   Pt son appears to be involved in pt care.    CURRENT CONCERNS Current Concerns  Post-Acute Placement   Other Concerns:    SOCIAL WORK ASSESSMENT / PLAN CSW informed that PT recommending SNF placement for pt however pt under observation.    CSW visited pt room and explored amount of support pt has at home. Pt stated he lives at home however does not feel that he will be safe to return home at discharge. CSW informed pt of challengs due to pt being under observation. CSW informed pt that pt insurance (Medicare) would not cover placement however RNCM would contact [facility] to determine whether pt secondary insurance ([employer]) would cover SNF placement. Pt very appreciative of intervention.   Assessment/plan status:  Psychosocial Support/Ongoing Assessment of Needs Other assessment/ plan:   Information/referral to community resources:   CSW provided pt with CSW contact information and a SNF list for possible SNF placement.    PATIENT'S/FAMILY'S RESPONSE TO PLAN OF CARE: Pt laying on recliner, alert and oriented. Pt very pleasant to speak to and interested in SNF placement. CSW to follow up with [employer] to determine whether pt insurance will cover SNF placement.       Theresia Bough, MSW, Theresia Majors [phone]
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
## 2013-03-02 NOTE — Evaluation (Signed)
Physical Therapy Evaluation Patient Details Name: Bruce [name] MRN: [id number] DOB: [birth year] Today's Date: 03/02/2013 Time: 3086-5784 PT Time Calculation (min): 33 min  PT Assessment / Plan / Recommendation Clinical Impression  Pt is a 67 yo male with complicated medical history who was admitted due to mechanical fall with loss of conscienceness. Pt with macular degeneration resulting in minimal vision in L eye. Pt currenlty lives alone and is driving without a liscence to get his groceries and care for his self. Pt is not safe to return home alone as he is at increased falls risk due to balance and vision impairments. Pt agrees he is unsafe to return home alone and desires to reside in an ALF however is agreeable to SNF to improve strength, balance, endurance and overall activity tolerance for safe transition to ALF. Spoke with MD and case management regarding d/c recommendations.    PT Assessment  Patient needs continued PT services    Follow Up Recommendations  SNF;Supervision/Assistance - 24 hour    Does the patient have the potential to tolerate intense rehabilitation      Barriers to Discharge Inaccessible home environment;Decreased caregiver support      Equipment Recommendations       Recommendations for Other Services     Frequency Min 3X/week    Precautions / Restrictions Precautions Precautions: Fall Precaution Comments: history of multiple falls Restrictions Weight Bearing Restrictions: No   Pertinent Vitals/Pain 8/10 bilat LE pain and L shoulder pain      Mobility  Bed Mobility Bed Mobility: Supine to Sit Supine to Sit: 5: Supervision;HOB flat Details for Bed Mobility Assistance: labored effort, increased time Transfers Transfers: Sit to Stand;Stand to Sit Sit to Stand: 4: Min guard;With upper extremity assist;From bed Stand to Sit: 4: Min assist;With upper extremity assist;With armrests;To chair/3-in-1 Details for Transfer Assistance: increased time,  v/cs for safe hand placement Ambulation/Gait Ambulation/Gait Assistance: 2: Max assist;4: Min assist Ambulation Distance (Feet): 100 Feet Assistive device: 1 person hand held assist;Rolling walker Ambulation/Gait Assistance Details: initially pt amb with L HHA however pt very unsteady with increased trunk flexion, antalgic limp, narrowed base of support and reaching with R UE for objects to hold onto. pt with increased stablity with RW however remains to require freq v/c's for walker safey and remains to have increased trunk flexion. Pt with report of bilat LE and L shoulder pain. Pt required tactile cue for directional walker management due to pt with impaired L eye vision Gait Pattern: Step-through pattern;Decreased step length - right;Decreased stance time - left;Decreased stride length;Antalgic;Narrow base of support Gait velocity: slow Stairs: No    Exercises     PT Diagnosis: Difficulty walking;Generalized weakness;Acute pain  PT Problem List: Decreased strength;Decreased activity tolerance;Decreased balance;Decreased mobility;Decreased safety awareness PT Treatment Interventions: DME instruction;Gait training;Stair training;Therapeutic exercise;Balance training;Therapeutic activities   PT Goals Acute Rehab PT Goals PT Goal Formulation: With patient Time For Goal Achievement: 03/09/13 Potential to Achieve Goals: Good Pt will go Supine/Side to Sit: with modified independence;with HOB 0 degrees PT Goal: Supine/Side to Sit - Progress: Goal set today Pt will go Sit to Stand: with supervision;with upper extremity assist (up to RW) PT Goal: Sit to Stand - Progress: Goal set today Pt will Transfer Bed to Chair/Chair to Bed: with supervision (with RW) PT Transfer Goal: Bed to Chair/Chair to Bed - Progress: Goal set today Pt will Ambulate: >150 feet;with supervision;with rolling walker PT Goal: Ambulate - Progress: Goal set today Pt will Go Up / Down Stairs:  3-5 stairs;with min assist (no  rails) PT Goal: Up/Down Stairs - Progress: Goal set today Pt will Perform Home Exercise Program: Independently PT Goal: Perform Home Exercise Program - Progress: Goal set today  Visit Information  Last PT Received On: 03/02/13 Assistance Needed: +1    Subjective Data  Subjective: Pt received supiine bed agreeable to PT.   Prior Functioning  Home Living Lives With: Alone Available Help at Discharge: Family;Available PRN/intermittently (Neice available every few weeks) Type of Home: Apartment Home Access: Stairs to enter Entrance Stairs-Number of Steps: 4 Entrance Stairs-Rails: None Home Layout: Two level;Able to live on main level with bedroom/bathroom Bathroom Shower/Tub: Engineer, manufacturing systems: Standard Bathroom Accessibility: Yes How Accessible: Accessible via walker Home Adaptive Equipment: Tub transfer bench;Hand-held shower hose;Grab bars in shower Prior Function Level of Independence: Independent with assistive device(s) Able to Take Stairs?: Yes (however unsafe) Driving: Yes (however pt with no license) Vocation: Retired Comments: pt living alone however with freq falls resulting in loss of consciencness Communication Communication: No difficulties Dominant Hand: Right    Cognition  Cognition Arousal/Alertness: Awake/alert Behavior During Therapy: WFL for tasks assessed/performed Overall Cognitive Status: Within Functional Limits for tasks assessed    Extremity/Trunk Assessment Right Upper Extremity Assessment RUE ROM/Strength/Tone: [facility] for tasks assessed Left Upper Extremity Assessment LUE ROM/Strength/Tone: Deficits LUE ROM/Strength/Tone Deficits: increased shoulder pain- increased time to achieve full ROM at shld. decreased grip at 3/5 Right Lower Extremity Assessment RLE ROM/Strength/Tone:  (Pt able to touch foot to head) RLE Sensation: Deficits RLE Coordination: WFL - gross motor Left Lower Extremity Assessment LLE ROM/Strength/Tone:  (Pt able  to touch head with foot) LLE Sensation: Deficits LLE Coordination: WFL - gross motor Trunk Assessment Trunk Assessment: Normal   Balance Balance Balance Assessed: Yes Static Sitting Balance Static Sitting - Balance Support: No upper extremity supported;Feet supported Static Sitting - Level of Assistance: 5: Stand by assistance Static Sitting - Comment/# of Minutes: 5 Static Standing Balance Static Standing - Balance Support: Left upper extremity supported Static Standing - Level of Assistance: 3: Mod assist Static Standing - Comment/# of Minutes: 1 min  End of Session PT - End of Session Equipment Utilized During Treatment: Gait belt Activity Tolerance: Patient limited by pain Patient left: in chair;with call bell/phone within reach Nurse Communication: Mobility status  GP     Marcene Brawn 03/02/2013, 3:54 PM   Lewis Shock, PT, DPT Pager #: [phone] Office #: [phone]

## 2013-03-02 NOTE — Progress Notes (Signed)
Spoke with [facility] regarding [facility] Care Management referral and how patient will need to have a [facility] PCP in order for [facility] Care Management to be able to follow. Reported that Mr Kainz is supposed to go to [facility] after discharge. At present time he does not have follow up scheduled. [place] PCP office can make referral once patient establishes with them. Raiford Noble, MSN, RN,BSN- [facility] [phone]

## 2013-03-03 DIAGNOSIS — F05 Delirium due to known physiological condition: Secondary | ICD-10-CM | POA: Diagnosis present

## 2013-03-03 DIAGNOSIS — R2681 Unsteadiness on feet: Secondary | ICD-10-CM | POA: Diagnosis present

## 2013-03-03 LAB — GLUCOSE, CAPILLARY: Glucose-Capillary: 137 mg/dL — ABNORMAL HIGH (ref 70–99)

## 2013-03-03 MED ORDER — GABAPENTIN 600 MG PO TABS
600.0000 mg | ORAL_TABLET | Freq: Every day | ORAL | Status: DC
  Filled 2013-03-03: qty 1

## 2013-03-03 MED ORDER — SENNOSIDES-DOCUSATE SODIUM 8.6-50 MG PO TABS
1.0000 | ORAL_TABLET | Freq: Once | ORAL | Status: DC | PRN
  Filled 2013-03-03: qty 1

## 2013-03-03 MED ORDER — SENNOSIDES-DOCUSATE SODIUM 8.6-50 MG PO TABS: 1.0000 | ORAL_TABLET | Freq: Once | ORAL | Status: DC

## 2013-03-03 MED ORDER — SUCRALFATE 1 G PO TABS: 1.0000 g | ORAL_TABLET | Freq: Every day | ORAL | Status: AC

## 2013-03-03 MED ORDER — GABAPENTIN 600 MG PO TABS: 600.0000 mg | ORAL_TABLET | Freq: Every day | ORAL | Status: AC

## 2013-03-03 MED ORDER — SUCRALFATE 1 G PO TABS
1.0000 g | ORAL_TABLET | Freq: Every day | ORAL | Status: DC
  Filled 2013-03-03: qty 1
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
## 2013-03-03 NOTE — Progress Notes (Signed)
Pt discharged per instructions to home with [facility]. All discharge instructions/information shared with time allowed for questions/concerns. Pt states he has none. Pt awaiting transport home via [facility]. Awaiting call with estimated time of arrival. Pt dressed and waiting in room. Pt is aware of the possible delay in getting home.   -Alwyn Ren, RN

## 2013-03-03 NOTE — Discharge Summary (Signed)
Name: Bruce [name] MRN: [id number] DOB: May 09, 1946 67 y.o. PCP: Pcp Not In System  Date of Admission: 02/28/2013  3:09 AM Date of Discharge: 03/03/2013 Attending Physician: Aletta Edouard, MD  Discharge Diagnosis: Principal Problem:   Epigastric pain Active Problems:   CAD (coronary artery disease)   Unsteady gait   PTSD (post-traumatic stress disorder)   Type 2 diabetes mellitus   Schizoaffective disorder, unspecified condition   Acute confusional state  Discharge Medications:   Medication List    TAKE these medications       aspirin 81 MG chewable tablet  Chew 1 tablet (81 mg total) by mouth daily.     carboxymethylcellulose 1 % ophthalmic solution  Place 1 drop into both eyes 2 (two) times daily as needed. For dryness     chlorhexidine 0.12 % solution  Commonly known as:  PERIDEX  Use as directed 15 mLs in the mouth or throat daily.     clopidogrel 75 MG tablet  Commonly known as:  PLAVIX  Take 75 mg by mouth daily.     dorzolamide 2 % ophthalmic solution  Commonly known as:  TRUSOPT  Place 1 drop into both eyes at bedtime.     gabapentin 600 MG tablet  Commonly known as:  NEURONTIN  Take 1 tablet (600 mg total) by mouth at bedtime.     insulin NPH-regular (70-30) 100 UNIT/ML injection  Commonly known as:  NOVOLIN 70/30  Inject 25-29 Units into the skin 2 (two) times daily with a meal. 25 units in the morning  29 units in the evening     insulin regular 100 units/mL injection  Commonly known as:  NOVOLIN R,HUMULIN R  Inject 3-20 Units into the skin 3 (three) times daily before meals. Per sliding scale.     isosorbide mononitrate 30 MG 24 hr tablet  Commonly known as:  IMDUR  Take 0.5 tablets (15 mg total) by mouth daily.     latanoprost 0.005 % ophthalmic solution  Commonly known as:  XALATAN  Place 1 drop into the left eye at bedtime.     metoprolol tartrate 25 MG tablet  Commonly known as:  LOPRESSOR  Take 12.5 mg by mouth daily. May take  an additional dose if needed for elevated blood pressure     nitroGLYCERIN 0.4 MG SL tablet  Commonly known as:  NITROSTAT  Place 0.4 mg under the tongue every 5 (five) minutes as needed. For chest pain     pantoprazole 40 MG tablet  Commonly known as:  PROTONIX  Take 1 tablet (40 mg total) by mouth daily.     sertraline 25 MG tablet  Commonly known as:  ZOLOFT  Take 12.5 mg by mouth daily. Take 1/2 a tablet by mouth once a day     sucralfate 1 G tablet  Commonly known as:  CARAFATE  Take 1 tablet (1 g total) by mouth daily.     terazosin 5 MG capsule  Commonly known as:  HYTRIN  Take 5 mg by mouth at bedtime.       Disposition and follow-up:   [name] was discharged from [facility] in Stable condition.  At the hospital follow up visit please address:  1.  Unsteady gait--needs to continue physical therapy, unable to go to SNF due to insurance and affordability issues, sent home with home health services ([facility], RN, PT, OT, Aide, and SW) and son from [place] is planning to relocate his father close  to him.   2.  CAD--follow up with Dr. Excell Seltzer, not on statin on chart review, defer to cardiology or pcp to start.  Continued on home medications.  Reassess on cardiology follow up to start or with PCP given HR in 50s at times.   3.  Hx of Schizophrenia--needs to follow up with pcp and psychiatry; follows with [facility].  Chronic pain--reports improvement with gabapentin.   5.  Hypertriglyceridemia per lipid panel 04/2012: not currently on statin.  Defer to pcp or cardiology to start if appropriate.   Follow-up Appointments: Follow-up Information   Follow up with Nicki Reaper, NP. Schedule an appointment as soon as possible for a visit on 04/17/2013. (@245pm )    Contact information:   [address] [place] [address] [phone]       Follow up with Tonny Bollman, MD On 03/13/2013. (@915am )    Contact information:   [address] [place] [address] [phone]       Schedule an appointment as soon as possible for a visit with [facility], MD.      Follow up with Dierdre Searles, NA, MD On 03/10/2013. (@215pm )    Contact information:   [address] [place] [address] [phone]      Discharge Instructions: Discharge Orders   Future Appointments Provider Department Dept Phone   03/13/2013 9:15 AM Tonny Bollman, MD [place] [facility] Main Office [place]) [phone]   Future Orders Complete By Expires     Diet - low sodium heart healthy  As directed     Increase activity slowly  As directed       Procedures Performed:  X-ray Chest Pa And Lateral   02/28/2013   *RADIOLOGY REPORT*  Clinical Data: Chest pain with inspiration.  CHEST - 2 VIEW  Comparison: Earlier today.  Findings: Normal sized heart.  Clear lungs.  A poor inspiration is again demonstrated.  Mild central peribronchial thickening. Minimal scoliosis.  IMPRESSION: Mild bronchitic changes.   Original Report Authenticated By: Beckie Salts, M.D.   Ct Abdomen Pelvis W Contrast  02/28/2013   *RADIOLOGY REPORT*  Clinical Data: Abdominal pain with nausea and vomiting.  CT ABDOMEN AND PELVIS WITH CONTRAST  Technique:  Multidetector CT imaging of the abdomen and pelvis was performed following the standard protocol during bolus administration of intravenous contrast.  Contrast: OMNIPAQUE IOHEXOL 300 MG/ML  SOLN  Comparison: None.  Findings: There is mild bibasilar atelectasis.  No evidence for pneumoperitoneum.  Evidence for the right coronary artery stent and/or calcification. Low density of the liver suggests steatosis.  The gallbladder has been removed.  Normal appearance of the spleen, pancreas, adrenal glands and kidneys. There is a 3 mm calcification in the left kidney upper pole that probably representing a nonobstructive kidney stone.  In addition, there appears to be small left renal cysts.  No significant free fluid  or lymphadenopathy  The prostate is prominent for size, measuring 6.3 cm in the transverse dimension. In addition, there appears to be nodular indentation along the bladder base from the prostate.  Normal appearance of the small and large bowel.  Normal appearance of the appendix.  Atherosclerotic calcifications in the aorta without aneurysmal dilatation.  Portal venous system is patent. Degenerative facet changes in the lower lumbar spine.  There is marked space disease at T12-L1.  Disc space disease at  L3-L4 and L4-L5.  Mild anterolisthesis at L3-L4 appears to be related to degenerative facet  changes.  IMPRESSION: No acute abnormalities in the abdomen or pelvis.  Nonobstructive left kidney stone.  Prostate hypertrophy.  Degenerative disease in the lumbar spine.  Probable hepatic steatosis.   Original Report Authenticated By: Richarda Overlie, M.D.   Dg Chest Port 1 View  02/28/2013   *RADIOLOGY REPORT*  Clinical Data: Chest pain, shortness of breath.  PORTABLE CHEST - 1 VIEW  Comparison: 10/09/2012  Findings: Low lung volumes.  Patchy atelectasis versus early infiltrates in the lung bases, left greater than right.  No effusion.  Heart size normal.  Regional bones unremarkable. Vascular clips in the right upper abdomen.  IMPRESSION:  1.  Low volumes with bibasilar atelectasis or infiltrates, left greater than right.   Original Report Authenticated By: D. Andria Rhein, MD   Admission HPI: Patient is a 67 year old man with PMH significant for CAD (with previous STEMI x2, s/p stents), T2DM, PTSD, who presents to the emergency department with complaints of abdominal pain. At time of admission his pain had resolved however he indicates that his pain on presentation was worst in the midepigastric area with radiation to the back. He states he is confused, and is not sure how long the pain has persisted, however he is able to relate that he has been having nausea for the last couple of days in association with the pain. He  denies any episodes of vomiting. He admits to diarrhea, however he states this is his baseline. He denies any fever or chills. Denies any chest pain or shortness of breath.  He states that he has been using his insulin as prescribed, with no recent changes in his regimen, however he reports that his CBGs at home have been elevated recently, often >600. He gives an inconsistent report regarding whether these elevations and CBGs have been associated with his new complaints of confusion or abdominal pain.  Hospital Course by problem list: Principal Problem:   Epigastric pain Active Problems:   CAD (coronary artery disease)   Unsteady gait   PTSD (post-traumatic stress disorder)   Type 2 diabetes mellitus   Schizoaffective disorder, unspecified condition   Acute confusional state   Abdominal pain--improved during hospital course.  Presented with complaints of midepigastric pain with radiation to the back initially, concerning for pancreatitis, however lipase was minimally elevated at 61, and CT abdomen was unrevealing. Thought to be secondary to GERD/dyspepsia with improvement noted with PPI therapy.  He denies prior hx of PUD and claims to have had an endoscopy within the last 1-2 years that was unrevealing of any ulcers, however no record on epic as he follows with the [place]. Recommend continuing protonix and carafate on discharge and follow up with PCP.    CAD - follows with [facility].  Hx of prior inferior STEMI in 02/2012 with 2 DES followed by a subsequent STEMI in 03/2012 associated with stent thrombosis after which pt underwent PTCA without stenting. He had a repeat cath in 10/2012 which did not reveal any significant changes in his CAD. On plavix and ASA and continued during hospital course and on discharge along with IMDUR.  Initially held Lopressor due to bradycardia, resumed on discharge.  On admission he endorsed occasional episodes of sharp midsternal chest pain at baseline but denied  any worsening of his chest pain over the last few weeks/months and during hospital course.  Cardiac enzymes negative.  Used to follow with Dr. Riley Kill who has since then retired and will now follow with Dr. Excell Seltzer as an outpatient.  Does  not appear to be on a statin, defer to pcp and cardiology for starting.   LDL 110.7 and HDL 31.3 and TG 416 04/2012.    Confusion--resolved.  Unclear as to what extent this issue is worsened over the patient's baseline, as he has a history of complaints of short-term memory loss and confusion. At the time of admission, the patient had already received IV fentanyl which made him very somnolent at that time and resolved over the course of admission. Follow up with PCP  Schizoaffective disorder - appears to be at baseline. Denied suicidal or homicidal ideation.  Currently not on any home medications. Hx of PTSD as well.  Alert, awake, and oriented to person, place, and time at time of discharge.  Needs to follow up with PCP and psychiatry. Follows at [facility] at this time.   T2DM with peripheral neuropathy - pt gives an inconsistent report as to glycemic control at home.  HbA1C 7.8 on 02/28/13.   CBGs monitored during hospital course, on SSI during hospital course.  Continued on gabapentin which he reports great improvement in pain.  Continued gabapentin and home insulin regimen on discharge with need for follow up with PCP.    PTSD--fought in the [place] War and has suffered from PTSD. He follows with [facility] for this issue.  On zoloft at home and continued during hospital course and on discharge.  Needs to follow up with PCP and psychiatry.     Unsteady gait--reported syncope and falls at home, does not appear acute per patient. Was on meclizine per prior cardiology note but not at time of admission, unclear why stopped. No episodes of syncope during hospital course, but noted to have unsteady gait per PT needing rehab facility discharge. However, unable to be  discharged to SNF due to insurance and affordability issues.  Sent home with assistance of social work and case management with home health services including RN, Aide, PT, OT, social worker, and disease management.  Unknown etiology. No anemia and TSH wnl. ?autonomic dysfunction given diabetes and hypertension that could be contributing. Son was also contacted who plans to relocate patient in the next few months closer to where he lives.  Needs to continue home health services and SNF if able to afford.  Needs to follow up with PCP and cardiology and review medications.  Currently follows at [facility].  May get [facility] care management in the future if establishes care with [facility] PCP.  Patient wishes to establish with [place] [facility] care, contact information given for office and appointment tentatively made for August 2014.    Discharge Vitals:   BP 99/55  Pulse 54  Temp(Src) 98.6 F (37 C) (Oral)  Resp 16  Ht 5\' 4"  (1.626 m)  Wt 181 lb (82.1 kg)  BMI 31.05 kg/m2  SpO2 96%  Discharge Labs:  Results for orders placed during the hospital encounter of 02/28/13 (from the past 24 hour(s))  GLUCOSE, CAPILLARY     Status: Abnormal   Collection Time    03/02/13  4:40 PM      Result Value Range   Glucose-Capillary 127 (*) 70 - 99 mg/dL  GLUCOSE, CAPILLARY     Status: Abnormal   Collection Time    03/02/13  9:06 PM      Result Value Range   Glucose-Capillary 154 (*) 70 - 99 mg/dL  GLUCOSE, CAPILLARY     Status: Abnormal   Collection Time    03/03/13  7:55 AM  Result Value Range   Glucose-Capillary 188 (*) 70 - 99 mg/dL  GLUCOSE, CAPILLARY     Status: Abnormal   Collection Time    03/03/13 11:29 AM      Result Value Range   Glucose-Capillary 160 (*) 70 - 99 mg/dL    Signed: Darden Palmer, MD 03/03/2013, 1:51 PM   Time Spent on Discharge: 35 minutes Services Ordered on Discharge: home health services: RN, PT, OT, Aide, social work, and disease Industrial/product designer Ordered on Discharge:  home health services, likely walker support

## 2013-03-03 NOTE — Progress Notes (Signed)
Subjective:    Bruce [name] was seen and examined at bedside.  He feels much better this morning but still has diffuse body pain and remains unstable on his feet.  He will be discharged to SNF if approved by insurance.  He denies any chest pain, N/V/D, fever, chills, or dysuria at this time.      Objective:    Vital Signs:   Temp:  [97.9 F (36.6 C)-98.7 F (37.1 C)] 98.7 F (37.1 C) (06/17 0537) Pulse Rate:  [51-69] 51 (06/17 0537) Resp:  [18] 18 (06/17 0537) BP: (137-161)/(71-77) 137/74 mmHg (06/17 0537) SpO2:  [95 %-99 %] 95 % (06/17 0537) Last BM Date: 03/02/13  24-hour weight change: Weight change:   Intake/Output:   Intake/Output Summary (Last 24 hours) at 03/03/13 0719 Last data filed at 03/03/13 0100  Gross per 24 hour  Intake    243 ml  Output    950 ml  Net   -707 ml     Physical Exam: General: Vital signs reviewed and noted. Well-developed, well-nourished,acute distress due to abdominal pain; alert, appropriate and cooperative throughout examination.  Lungs:  CTA B/L  Heart: RRR. S1 and S2 normal without gallop, murmur, or rubs.  Abdomen:  BS normoactive. Soft, Nondistended, minimal diffuse tenderness to palpation of epigastric region.   Extremities: Neuro: No pretibial edema. +2dp b/l, Right hip pain when standing Gait unstable/unsteady but able to stand with support, poor effort on strength testing, 4/5 in b/l extremities    Labs:  Basic Metabolic Panel:  Recent Labs Lab 02/28/13 0316 03/01/13 0510  NA 136 140  K 3.8 3.8  CL 102 106  CO2 22 27  GLUCOSE 348* 137*  BUN 16 10  CREATININE 0.88 0.92  CALCIUM 9.3 9.2    Liver Function Tests:  Recent Labs Lab 02/28/13 0316 03/01/13 0510  AST 23 22  ALT 23 21  ALKPHOS 77 73  BILITOT 0.3 0.4  PROT 6.8 6.4  ALBUMIN 3.9 3.6    Recent Labs Lab 02/28/13 0316  LIPASE 61*    CBC:  Recent Labs Lab 02/28/13 0316 03/01/13 0510  WBC 10.1 6.3  NEUTROABS 7.4  --   HGB 13.7 14.2  HCT  38.5* 40.9  MCV 90.0 90.5  PLT 138* 124*    Cardiac Enzymes:  Recent Labs Lab 02/28/13 0723 02/28/13 1250 02/28/13 1728  CKTOTAL  --  196  --   TROPONINI <0.30 <0.30 <0.30    CBG:  Recent Labs Lab 03/01/13 2114 03/02/13 0751 03/02/13 1215 03/02/13 1640 03/02/13 2106  GLUCAP 163* 177* 156* 127* 154*   Microbiology: Results for orders placed during the hospital encounter of 03/02/12  MRSA PCR SCREENING     Status: None   Collection Time    03/02/12  3:18 PM      Result Value Range Status   MRSA by PCR NEGATIVE  NEGATIVE Final   Comment:            The GeneXpert MRSA Assay (FDA     approved for NASAL specimens     only), is one component of a     comprehensive MRSA colonization     surveillance program. It is not     intended to diagnose MRSA     infection nor to guide or     monitor treatment for     MRSA infections.     Medications:    Infusions:   Scheduled Medications: . aspirin  81 mg Oral Daily  .  clopidogrel  75 mg Oral Daily  . dorzolamide  1 drop Both Eyes QHS  . enoxaparin (LOVENOX) injection  40 mg Subcutaneous Q24H  . gabapentin  600 mg Oral Daily  . insulin aspart  0-9 Units Subcutaneous TID WC  . isosorbide mononitrate  15 mg Oral Daily  . latanoprost  1 drop Left Eye QHS  . pantoprazole  40 mg Oral Daily  . sertraline  12.5 mg Oral Daily  . sodium chloride  3 mL Intravenous Q12H  . terazosin  5 mg Oral QHS   PRN Medications: acetaminophen, gi cocktail, ondansetron (ZOFRAN) IV, ondansetron, polyvinyl alcohol  Assessment/ Plan:   Bruce [name] is a 67 year old male with PMH of DM2, HL, and PTSD presenting with epigastric abdominal pain.  Lipase mildly elevated 61, CT abdomen showed non-obstructive left kidney stone and prostate hypertrophy.  Likely secondary to gastritis or dyspepsia.    Abdominal pain - mainly in epigastric region. Workup has been negative and it seems that his discomfort is most likely secondary to GERD given improvement  in symptoms with protonix.  -gi cocktail prn -protonix 40mg  qd  CAD - previous inferior STEMI in 02/2012 with 2 DES followed by a subsequent STEMI in 03/2012 associated with stent thrombosis after which he underwent PTCA without stenting. Repeat cath in 10/2012 which did not reveal any significant changes in his CAD. On ASA and Plavix at home.  Chest pain on admission resolved, troponin x3 negative.  He follows with [facility] Riley Kill [facility] and will continue follow up with Dr. Excell Seltzer.  - cont ASA  - cont imdur  - cont plavix   Schizoaffective disorder - appears to be at baseline. Most likely secondary to the patient's very active and currently unmedicated schizoaffective disorder.  -will need to follow up with psychiatry as an outpatient.  Follows with [facility].    PTSD -[place] veteran. - follows with [facility] for this issue.   Unsteady gait--reported syncope and falls at home, does not appear acute per patient. Was on meclizine per prior cardiology note.  No episodes during hospital course, but noted to have unsteady gait per PT needing rehab facility discharge.  Unknown etiology. No anemia and TSH wnl.  ?autonomic dysfunction given diabetes and hypertension that could be contributing.   -f/u PT -d/c to SNF with assistance of social work and case management  Chronic pain--back and "hurts all over".  Claims gabapentin works for him.   -continue gabapentin, PM dose daily -follow up with PCP  Dispo - d/c to rehab per PT.  He states he is not able to regularly attend appointments with his PCP at the [place] due to multiple issues including lack of transportation. He states he is not able to be scheduled to see a psychiatrist at the [facility] for more frequently than once every 1-2 years. For these reasons, the patient appears to need to establish with a new and local PCP in order to have sufficient care of his multiple active medical and psychiatric issues. He wishes to follow with [facility] primary care to  establish with a PCP, as he is already being seen by [facility]. He has their contact information and will need to schedule follow up.  He will need referral to psychiatry in the near future as well.    The patient does not have a current PCP (Pcp Not In System) and does not need an [facility] hospital follow-up appointment after discharge  Does the patient have transportation limitations  that hinder transportation to clinic appointments? yes          Y = Yes, Blank = No PT: Rehab facility  OT:   RN:   Equipment:   Other:     Length of Stay: 3 day(s)  Signed: Darden Palmer, MD  PGY-1, Internal Medicine Resident Pager: [phone] (7AM-5PM) 03/03/2013, 7:19 AM

## 2013-03-03 NOTE — Progress Notes (Addendum)
CSW informed by SNF facility that pt would have to pay out of pocket in order to be admitted into a facility due to pt being on observation. Unfortunately pt secondary insurance also requires a 3 day qualifying stay. CSW has informed pt who is agreeable to return home with [facility] services. CSW spoke with pt son who is also agreeable to pt returning with [facility] services, and will consider hiring a private duty agency (CSW emailing private duty agency list.) Son also planning to relocate pt in the next few months. RNCM to assist with ordering [facility] services. CSW to assist with transport via EMS. No additional needs CSW signing off.  Theresia Bough, MSW, Theresia Majors [phone]

## 2013-03-06 ENCOUNTER — Telehealth: Payer: Self-pay | Admitting: Internal Medicine

## 2013-03-06 DIAGNOSIS — F259 Schizoaffective disorder, unspecified: Secondary | ICD-10-CM | POA: Diagnosis present

## 2013-03-06 NOTE — Telephone Encounter (Signed)
Thank you for following up on this

## 2013-03-06 NOTE — Discharge Summary (Signed)
Internal Medicine Teaching Service Attending Note Date: 03/06/2013  Patient name: Bruce [name]  Medical record number: [id number]  Date of birth: [birth year]    I evaluated the patient on the day of discharge and discussed the discharge plan with my resident team. Please see my note and the resident note from the day of discharge. I have read the plan of care by Dr. Virgina Organ and I agree with the discharge documentation and disposition. The patient has additionally been called by Dr. Virgina Organ today, as I am informed, and he seems to be doing well. He has PCP and cardiology appointments coming up.      Thanks Aletta Edouard 03/06/2013, 5:49 PM

## 2013-03-06 NOTE — Telephone Encounter (Signed)
Called and spoke with Mr. Fikes this evening and yesterday.  I scheduled a follow up appointment for him in our [facility] clinic for early next week on 03/10/13 as he did not have [facility] follow up with pcp scheduled at that time.  However, when I spoke to him again today, he informed me he was able to schedule a [facility] appointment with his PCP on 03/10/13, thus, he will not need to come to [facility].  I will ask the front desk to cancel his appointment.    I reminded him of his appointment with cardiology, Dr. Excell Seltzer on 03/13/13 that he acknowledges and plans to attend.  Since he cannot drive anymore, he is trying to arrange transportation for both appointments before hand.    Finally, we discussed his medications and he has been taking all his medications as usual including his Lopressor for his extensive cardiac history.  He says his current medication regimen is what has been working for him thus far and he wishes to continue.  He claims to be feeling well since discharge with improved dizziness.  I informed him to discuss with his cardiologist and his PCP his medications and re-evaluate them as needing, including his beta blocker given that his heart rate can be in the 50s at times.  I also included this in my discharge summary that can be accessed on epic. If he notices worsening of symptoms, I instructed him to call the clinic or his pcp office or cardiology office and if severe, to go to the emergency room. He acknowledges understanding and will comply.    This plan and conversation has been discussed with Dr. Dalphine Handing and she is in agreement along with the resident Dr. Dorise Hiss.

## 2013-03-10 ENCOUNTER — Ambulatory Visit: Payer: Medicare Other | Admitting: Internal Medicine

## 2013-03-12 ENCOUNTER — Emergency Department (HOSPITAL_COMMUNITY)
Admission: EM | Admit: 2013-03-12 | Discharge: 2013-03-12 | Disposition: A | Discharge status: Home / Self Care | Payer: Medicare Other | Attending: Emergency Medicine | Admitting: Emergency Medicine

## 2013-03-12 ENCOUNTER — Emergency Department (HOSPITAL_COMMUNITY): Payer: Medicare Other

## 2013-03-12 ENCOUNTER — Encounter (HOSPITAL_COMMUNITY): Payer: Self-pay

## 2013-03-12 DIAGNOSIS — R109 Unspecified abdominal pain: Secondary | ICD-10-CM | POA: Insufficient documentation

## 2013-03-12 DIAGNOSIS — G20A1 Parkinson's disease without dyskinesia, without mention of fluctuations: Secondary | ICD-10-CM | POA: Insufficient documentation

## 2013-03-12 DIAGNOSIS — Z862 Personal history of diseases of the blood and blood-forming organs and certain disorders involving the immune mechanism: Secondary | ICD-10-CM | POA: Insufficient documentation

## 2013-03-12 DIAGNOSIS — R531 Weakness: Secondary | ICD-10-CM

## 2013-03-12 DIAGNOSIS — Z8639 Personal history of other endocrine, nutritional and metabolic disease: Secondary | ICD-10-CM | POA: Insufficient documentation

## 2013-03-12 DIAGNOSIS — Z8659 Personal history of other mental and behavioral disorders: Secondary | ICD-10-CM | POA: Insufficient documentation

## 2013-03-12 DIAGNOSIS — R5381 Other malaise: Secondary | ICD-10-CM | POA: Insufficient documentation

## 2013-03-12 DIAGNOSIS — R42 Dizziness and giddiness: Secondary | ICD-10-CM | POA: Insufficient documentation

## 2013-03-12 DIAGNOSIS — G20 Parkinson's disease: Secondary | ICD-10-CM | POA: Insufficient documentation

## 2013-03-12 DIAGNOSIS — I10 Essential (primary) hypertension: Secondary | ICD-10-CM | POA: Insufficient documentation

## 2013-03-12 DIAGNOSIS — Z79899 Other long term (current) drug therapy: Secondary | ICD-10-CM | POA: Insufficient documentation

## 2013-03-12 DIAGNOSIS — F411 Generalized anxiety disorder: Secondary | ICD-10-CM | POA: Insufficient documentation

## 2013-03-12 DIAGNOSIS — Z87828 Personal history of other (healed) physical injury and trauma: Secondary | ICD-10-CM | POA: Insufficient documentation

## 2013-03-12 DIAGNOSIS — E1149 Type 2 diabetes mellitus with other diabetic neurological complication: Secondary | ICD-10-CM | POA: Insufficient documentation

## 2013-03-12 DIAGNOSIS — M545 Low back pain, unspecified: Secondary | ICD-10-CM | POA: Insufficient documentation

## 2013-03-12 DIAGNOSIS — Z7982 Long term (current) use of aspirin: Secondary | ICD-10-CM | POA: Insufficient documentation

## 2013-03-12 DIAGNOSIS — F3289 Other specified depressive episodes: Secondary | ICD-10-CM | POA: Insufficient documentation

## 2013-03-12 DIAGNOSIS — Z8669 Personal history of other diseases of the nervous system and sense organs: Secondary | ICD-10-CM | POA: Insufficient documentation

## 2013-03-12 DIAGNOSIS — G909 Disorder of the autonomic nervous system, unspecified: Secondary | ICD-10-CM | POA: Insufficient documentation

## 2013-03-12 DIAGNOSIS — Z7902 Long term (current) use of antithrombotics/antiplatelets: Secondary | ICD-10-CM | POA: Insufficient documentation

## 2013-03-12 DIAGNOSIS — Z794 Long term (current) use of insulin: Secondary | ICD-10-CM | POA: Insufficient documentation

## 2013-03-12 DIAGNOSIS — N400 Enlarged prostate without lower urinary tract symptoms: Secondary | ICD-10-CM | POA: Insufficient documentation

## 2013-03-12 DIAGNOSIS — R55 Syncope and collapse: Secondary | ICD-10-CM | POA: Insufficient documentation

## 2013-03-12 DIAGNOSIS — I2510 Atherosclerotic heart disease of native coronary artery without angina pectoris: Secondary | ICD-10-CM | POA: Insufficient documentation

## 2013-03-12 DIAGNOSIS — K219 Gastro-esophageal reflux disease without esophagitis: Secondary | ICD-10-CM | POA: Insufficient documentation

## 2013-03-12 DIAGNOSIS — Z8619 Personal history of other infectious and parasitic diseases: Secondary | ICD-10-CM | POA: Insufficient documentation

## 2013-03-12 DIAGNOSIS — F329 Major depressive disorder, single episode, unspecified: Secondary | ICD-10-CM | POA: Insufficient documentation

## 2013-03-12 DIAGNOSIS — Z8719 Personal history of other diseases of the digestive system: Secondary | ICD-10-CM | POA: Insufficient documentation

## 2013-03-12 DIAGNOSIS — R0602 Shortness of breath: Secondary | ICD-10-CM | POA: Insufficient documentation

## 2013-03-12 DIAGNOSIS — Z9861 Coronary angioplasty status: Secondary | ICD-10-CM | POA: Insufficient documentation

## 2013-03-12 DIAGNOSIS — F4310 Post-traumatic stress disorder, unspecified: Secondary | ICD-10-CM | POA: Insufficient documentation

## 2013-03-12 LAB — CBC WITH DIFFERENTIAL/PLATELET
Eosinophils Relative: 2 % (ref 0–5)
HCT: 46.4 % (ref 39.0–52.0)
Hemoglobin: 16.8 g/dL (ref 13.0–17.0)
Lymphocytes Relative: 25 % (ref 12–46)
Lymphs Abs: 1.8 10*3/uL (ref 0.7–4.0)
MCV: 88.2 fL (ref 78.0–100.0)
Monocytes Absolute: 0.5 10*3/uL (ref 0.1–1.0)
Monocytes Relative: 7 % (ref 3–12)
Neutro Abs: 4.8 10*3/uL (ref 1.7–7.7)
WBC: 7.3 10*3/uL (ref 4.0–10.5)

## 2013-03-12 LAB — COMPREHENSIVE METABOLIC PANEL
AST: 22 U/L (ref 0–37)
BUN: 7 mg/dL (ref 6–23)
CO2: 25 mEq/L (ref 19–32)
Calcium: 10 mg/dL (ref 8.4–10.5)
Chloride: 101 mEq/L (ref 96–112)
Creatinine, Ser: 0.89 mg/dL (ref 0.50–1.35)
GFR calc Af Amer: >90 mL/min (ref >90)
GFR calc non Af Amer: 87 mL/min — ABNORMAL LOW (ref >90)
Glucose, Bld: 127 mg/dL — ABNORMAL HIGH (ref 70–99)
Total Bilirubin: 0.4 mg/dL (ref 0.3–1.2)

## 2013-03-12 LAB — URINALYSIS, ROUTINE W REFLEX MICROSCOPIC
Ketones, ur: NEGATIVE mg/dL
Leukocytes, UA: NEGATIVE
Nitrite: NEGATIVE
Protein, ur: NEGATIVE mg/dL
Urobilinogen, UA: 0.2 mg/dL (ref 0.0–1.0)

## 2013-03-12 LAB — TROPONIN I: Troponin I: <0.3 ng/mL (ref <0.30)

## 2013-03-12 MED ORDER — SODIUM CHLORIDE 0.9 % IV SOLN
INTRAVENOUS | Status: DC
  Administered 2013-03-12: 18:00:00 via INTRAVENOUS
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
## 2013-03-12 NOTE — ED Notes (Signed)
Pt. Is here with pain all over , near syncopal episode.  Pt. Is alert and oriented X 4.

## 2013-03-13 ENCOUNTER — Ambulatory Visit (INDEPENDENT_AMBULATORY_CARE_PROVIDER_SITE_OTHER): Payer: Medicare Other | Admitting: Cardiovascular Disease

## 2013-03-13 ENCOUNTER — Encounter: Payer: Self-pay | Admitting: Cardiovascular Disease

## 2013-03-13 VITALS — BP 132/76 | HR 58 | Ht 64.0 in | Wt 176.8 lb

## 2013-03-13 DIAGNOSIS — I2510 Atherosclerotic heart disease of native coronary artery without angina pectoris: Secondary | ICD-10-CM

## 2013-03-13 DIAGNOSIS — E7800 Pure hypercholesterolemia, unspecified: Secondary | ICD-10-CM

## 2013-03-13 MED ORDER — ATORVASTATIN CALCIUM 10 MG PO TABS: 10.0000 mg | ORAL_TABLET | Freq: Every day | ORAL | Status: AC
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
## 2013-03-13 NOTE — Patient Instructions (Signed)
Your physician has recommended you make the following change in your medication: START Atorvastatin 10mg  take one by mouth daily  Your physician recommends that you return for a FASTING LIPID and LIVER Profile in 3 MONTHS--nothing to eat or drink after midnight, lab opens at 7:30  Your physician wants you to follow-up in: 6 MONTHS with Dr Excell Seltzer.  You will receive a reminder letter in the mail two months in advance. If you don't receive a letter, please call our office to schedule the follow-up appointment.

## 2013-04-17 ENCOUNTER — Ambulatory Visit: Payer: Medicare Other | Admitting: Internal Medicine

## 2013-05-26 ENCOUNTER — Other Ambulatory Visit: Payer: Medicare Other

## 2013-06-03 ENCOUNTER — Emergency Department (HOSPITAL_COMMUNITY): Payer: Medicare Other

## 2013-06-03 ENCOUNTER — Inpatient Hospital Stay (HOSPITAL_COMMUNITY)
Admission: EM | Admit: 2013-06-03 | Discharge: 2013-06-08 | DRG: 948 | Disposition: A | Discharge status: Home / Self Care | Payer: Medicare Other | Attending: Internal Medicine | Admitting: Internal Medicine

## 2013-06-03 ENCOUNTER — Encounter (HOSPITAL_COMMUNITY): Payer: Self-pay | Admitting: *Deleted

## 2013-06-03 DIAGNOSIS — E1149 Type 2 diabetes mellitus with other diabetic neurological complication: Secondary | ICD-10-CM | POA: Diagnosis present

## 2013-06-03 DIAGNOSIS — F329 Major depressive disorder, single episode, unspecified: Secondary | ICD-10-CM | POA: Diagnosis present

## 2013-06-03 DIAGNOSIS — R109 Unspecified abdominal pain: Secondary | ICD-10-CM | POA: Diagnosis present

## 2013-06-03 DIAGNOSIS — R531 Weakness: Secondary | ICD-10-CM

## 2013-06-03 DIAGNOSIS — E785 Hyperlipidemia, unspecified: Secondary | ICD-10-CM

## 2013-06-03 DIAGNOSIS — H5316 Psychophysical visual disturbances: Secondary | ICD-10-CM | POA: Diagnosis present

## 2013-06-03 DIAGNOSIS — R739 Hyperglycemia, unspecified: Secondary | ICD-10-CM

## 2013-06-03 DIAGNOSIS — R1013 Epigastric pain: Secondary | ICD-10-CM

## 2013-06-03 DIAGNOSIS — I2119 ST elevation (STEMI) myocardial infarction involving other coronary artery of inferior wall: Secondary | ICD-10-CM

## 2013-06-03 DIAGNOSIS — Z91199 Patient's noncompliance with other medical treatment and regimen due to unspecified reason: Secondary | ICD-10-CM

## 2013-06-03 DIAGNOSIS — Z9114 Patient's other noncompliance with medication regimen: Secondary | ICD-10-CM

## 2013-06-03 DIAGNOSIS — F32A Depression, unspecified: Secondary | ICD-10-CM

## 2013-06-03 DIAGNOSIS — F6081 Narcissistic personality disorder: Secondary | ICD-10-CM | POA: Diagnosis present

## 2013-06-03 DIAGNOSIS — F3289 Other specified depressive episodes: Secondary | ICD-10-CM | POA: Diagnosis present

## 2013-06-03 DIAGNOSIS — F1210 Cannabis abuse, uncomplicated: Secondary | ICD-10-CM | POA: Diagnosis present

## 2013-06-03 DIAGNOSIS — I2510 Atherosclerotic heart disease of native coronary artery without angina pectoris: Secondary | ICD-10-CM

## 2013-06-03 DIAGNOSIS — R2681 Unsteadiness on feet: Secondary | ICD-10-CM

## 2013-06-03 DIAGNOSIS — R55 Syncope and collapse: Secondary | ICD-10-CM

## 2013-06-03 DIAGNOSIS — D696 Thrombocytopenia, unspecified: Secondary | ICD-10-CM

## 2013-06-03 DIAGNOSIS — Z9119 Patient's noncompliance with other medical treatment and regimen: Secondary | ICD-10-CM

## 2013-06-03 DIAGNOSIS — K5900 Constipation, unspecified: Secondary | ICD-10-CM | POA: Diagnosis present

## 2013-06-03 DIAGNOSIS — I10 Essential (primary) hypertension: Secondary | ICD-10-CM

## 2013-06-03 DIAGNOSIS — E119 Type 2 diabetes mellitus without complications: Secondary | ICD-10-CM

## 2013-06-03 DIAGNOSIS — F259 Schizoaffective disorder, unspecified: Secondary | ICD-10-CM

## 2013-06-03 DIAGNOSIS — F4310 Post-traumatic stress disorder, unspecified: Secondary | ICD-10-CM

## 2013-06-03 DIAGNOSIS — Y92009 Unspecified place in unspecified non-institutional (private) residence as the place of occurrence of the external cause: Secondary | ICD-10-CM

## 2013-06-03 DIAGNOSIS — R319 Hematuria, unspecified: Secondary | ICD-10-CM | POA: Diagnosis not present

## 2013-06-03 DIAGNOSIS — R339 Retention of urine, unspecified: Secondary | ICD-10-CM | POA: Diagnosis not present

## 2013-06-03 DIAGNOSIS — R5381 Other malaise: Principal | ICD-10-CM | POA: Diagnosis present

## 2013-06-03 DIAGNOSIS — R110 Nausea: Secondary | ICD-10-CM | POA: Diagnosis present

## 2013-06-03 DIAGNOSIS — Z91148 Patient's other noncompliance with medication regimen for other reason: Secondary | ICD-10-CM

## 2013-06-03 DIAGNOSIS — K219 Gastro-esophageal reflux disease without esophagitis: Secondary | ICD-10-CM | POA: Diagnosis present

## 2013-06-03 DIAGNOSIS — R443 Hallucinations, unspecified: Secondary | ICD-10-CM

## 2013-06-03 DIAGNOSIS — R0789 Other chest pain: Secondary | ICD-10-CM

## 2013-06-03 DIAGNOSIS — F05 Delirium due to known physiological condition: Secondary | ICD-10-CM

## 2013-06-03 DIAGNOSIS — Z794 Long term (current) use of insulin: Secondary | ICD-10-CM

## 2013-06-03 DIAGNOSIS — F200 Paranoid schizophrenia: Secondary | ICD-10-CM

## 2013-06-03 DIAGNOSIS — W19XXXA Unspecified fall, initial encounter: Secondary | ICD-10-CM | POA: Diagnosis present

## 2013-06-03 DIAGNOSIS — E876 Hypokalemia: Secondary | ICD-10-CM

## 2013-06-03 LAB — CBC WITH DIFFERENTIAL/PLATELET
Basophils Absolute: 0 10*3/uL (ref 0.0–0.1)
Basophils Relative: 0 % (ref 0–1)
Eosinophils Absolute: 0.2 10*3/uL (ref 0.0–0.7)
Lymphs Abs: 1.5 10*3/uL (ref 0.7–4.0)
MCH: 31.8 pg (ref 26.0–34.0)
Neutrophils Relative %: 76 % (ref 43–77)
Platelets: 159 10*3/uL (ref 150–400)
RBC: 4.88 MIL/uL (ref 4.22–5.81)
RDW: 12.8 % (ref 11.5–15.5)

## 2013-06-03 LAB — LIPASE, BLOOD: Lipase: 56 U/L (ref 11–59)

## 2013-06-03 LAB — URINALYSIS, ROUTINE W REFLEX MICROSCOPIC
Bilirubin Urine: NEGATIVE
Nitrite: NEGATIVE
Protein, ur: NEGATIVE mg/dL
Specific Gravity, Urine: 1.038 — ABNORMAL HIGH (ref 1.005–1.030)
Urobilinogen, UA: 1 mg/dL (ref 0.0–1.0)

## 2013-06-03 LAB — COMPREHENSIVE METABOLIC PANEL
BUN: 9 mg/dL (ref 6–23)
Calcium: 9.9 mg/dL (ref 8.4–10.5)
GFR calc Af Amer: >90 mL/min (ref >90)
GFR calc non Af Amer: >90 mL/min (ref >90)
Glucose, Bld: 362 mg/dL — ABNORMAL HIGH (ref 70–99)
Total Protein: 7.2 g/dL (ref 6.0–8.3)

## 2013-06-03 LAB — URINE MICROSCOPIC-ADD ON

## 2013-06-03 LAB — POCT I-STAT TROPONIN I: Troponin i, poc: 0 ng/mL (ref 0.00–0.08)

## 2013-06-03 MED ORDER — SODIUM CHLORIDE 0.9 % IV SOLN
1000.0000 mL | INTRAVENOUS | Status: DC
  Administered 2013-06-04 – 2013-06-06 (×7): 1000 mL via INTRAVENOUS

## 2013-06-03 MED ORDER — PANTOPRAZOLE SODIUM 40 MG PO TBEC
40.0000 mg | DELAYED_RELEASE_TABLET | Freq: Every day | ORAL | Status: DC
  Administered 2013-06-04 – 2013-06-08 (×5): 40 mg via ORAL
  Filled 2013-06-03 (×6): qty 1

## 2013-06-03 MED ORDER — ONDANSETRON HCL 4 MG/2ML IJ SOLN
4.0000 mg | Freq: Once | INTRAMUSCULAR | Status: AC
  Administered 2013-06-03: 4 mg via INTRAVENOUS
  Filled 2013-06-03: qty 2

## 2013-06-03 MED ORDER — CLOPIDOGREL BISULFATE 75 MG PO TABS
75.0000 mg | ORAL_TABLET | Freq: Every day | ORAL | Status: DC
  Administered 2013-06-04 – 2013-06-07 (×5): 75 mg via ORAL
  Filled 2013-06-03 (×6): qty 1

## 2013-06-03 MED ORDER — CHLORHEXIDINE GLUCONATE 0.12 % MT SOLN
15.0000 mL | Freq: Every day | OROMUCOSAL | Status: DC
  Administered 2013-06-04 – 2013-06-07 (×5): 15 mL via OROMUCOSAL
  Filled 2013-06-03 (×6): qty 15

## 2013-06-03 MED ORDER — ENOXAPARIN SODIUM 40 MG/0.4ML ~~LOC~~ SOLN
40.0000 mg | Freq: Every day | SUBCUTANEOUS | Status: DC
  Administered 2013-06-04 – 2013-06-07 (×5): 40 mg via SUBCUTANEOUS
  Filled 2013-06-03 (×6): qty 0.4

## 2013-06-03 MED ORDER — INSULIN ASPART PROT & ASPART (70-30 MIX) 100 UNIT/ML ~~LOC~~ SUSP
25.0000 [IU] | Freq: Two times a day (BID) | SUBCUTANEOUS | Status: DC
  Administered 2013-06-04 – 2013-06-08 (×7): 25 [IU] via SUBCUTANEOUS
  Filled 2013-06-03: qty 10

## 2013-06-03 MED ORDER — INSULIN ASPART 100 UNIT/ML ~~LOC~~ SOLN
4.0000 [IU] | Freq: Three times a day (TID) | SUBCUTANEOUS | Status: DC
  Administered 2013-06-04 – 2013-06-08 (×11): 4 [IU] via SUBCUTANEOUS

## 2013-06-03 MED ORDER — SUCRALFATE 1 G PO TABS
1.0000 g | ORAL_TABLET | Freq: Every day | ORAL | Status: DC
  Administered 2013-06-04 – 2013-06-08 (×5): 1 g via ORAL
  Filled 2013-06-03 (×5): qty 1

## 2013-06-03 MED ORDER — ASPIRIN 81 MG PO CHEW
81.0000 mg | CHEWABLE_TABLET | Freq: Every day | ORAL | Status: DC
  Administered 2013-06-04 – 2013-06-07 (×5): 81 mg via ORAL
  Filled 2013-06-03 (×7): qty 1

## 2013-06-03 MED ORDER — GABAPENTIN 300 MG PO CAPS
600.0000 mg | ORAL_CAPSULE | Freq: Every day | ORAL | Status: DC
  Administered 2013-06-04 – 2013-06-07 (×5): 600 mg via ORAL
  Filled 2013-06-03 (×6): qty 2

## 2013-06-03 MED ORDER — ONDANSETRON HCL 4 MG/2ML IJ SOLN
4.0000 mg | Freq: Four times a day (QID) | INTRAMUSCULAR | Status: DC | PRN
  Administered 2013-06-04: 20:00:00 4 mg via INTRAVENOUS
  Filled 2013-06-03: qty 2

## 2013-06-03 MED ORDER — TERAZOSIN HCL 5 MG PO CAPS
5.0000 mg | ORAL_CAPSULE | Freq: Every day | ORAL | Status: DC
  Administered 2013-06-04 – 2013-06-05 (×3): 5 mg via ORAL
  Filled 2013-06-03 (×4): qty 1

## 2013-06-03 MED ORDER — SERTRALINE HCL 25 MG PO TABS
12.5000 mg | ORAL_TABLET | Freq: Every day | ORAL | Status: DC
  Administered 2013-06-04 – 2013-06-07 (×5): 12.5 mg via ORAL
  Filled 2013-06-03 (×6): qty 0.5

## 2013-06-03 MED ORDER — SODIUM CHLORIDE 0.9 % IJ SOLN
3.0000 mL | Freq: Two times a day (BID) | INTRAMUSCULAR | Status: DC
  Administered 2013-06-04: 22:00:00 3 mL via INTRAVENOUS
  Administered 2013-06-06: 22:00:00 via INTRAVENOUS

## 2013-06-03 MED ORDER — METOPROLOL TARTRATE 12.5 MG HALF TABLET
12.5000 mg | ORAL_TABLET | Freq: Every day | ORAL | Status: DC
  Administered 2013-06-05 – 2013-06-07 (×4): 12.5 mg via ORAL
  Filled 2013-06-03 (×6): qty 1

## 2013-06-03 MED ORDER — DORZOLAMIDE HCL 2 % OP SOLN
1.0000 [drp] | Freq: Every day | OPHTHALMIC | Status: DC
  Administered 2013-06-04 – 2013-06-07 (×5): 1 [drp] via OPHTHALMIC
  Filled 2013-06-03: qty 10

## 2013-06-03 MED ORDER — LATANOPROST 0.005 % OP SOLN
1.0000 [drp] | Freq: Every day | OPHTHALMIC | Status: DC
  Administered 2013-06-04 – 2013-06-07 (×5): 1 [drp] via OPHTHALMIC
  Filled 2013-06-03: qty 2.5

## 2013-06-03 MED ORDER — SODIUM CHLORIDE 0.9 % IV SOLN
1000.0000 mL | Freq: Once | INTRAVENOUS | Status: AC
  Administered 2013-06-03: 1000 mL via INTRAVENOUS

## 2013-06-03 MED ORDER — ISOSORBIDE MONONITRATE 15 MG HALF TABLET
15.0000 mg | ORAL_TABLET | Freq: Every day | ORAL | Status: DC
  Administered 2013-06-04 – 2013-06-07 (×5): 15 mg via ORAL
  Filled 2013-06-03 (×6): qty 1

## 2013-06-03 MED ORDER — SODIUM CHLORIDE 0.9 % IV SOLN
1000.0000 mL | INTRAVENOUS | Status: DC
  Administered 2013-06-03: 1000 mL via INTRAVENOUS

## 2013-06-03 MED ORDER — IOHEXOL 300 MG/ML  SOLN
100.0000 mL | Freq: Once | INTRAMUSCULAR | Status: AC | PRN
  Administered 2013-06-03: 100 mL via INTRAVENOUS

## 2013-06-03 MED ORDER — ONDANSETRON HCL 4 MG PO TABS
4.0000 mg | ORAL_TABLET | Freq: Four times a day (QID) | ORAL | Status: DC | PRN
  Administered 2013-06-04: 11:00:00 4 mg via ORAL
  Filled 2013-06-03: qty 1

## 2013-06-03 MED ORDER — HYDROMORPHONE HCL PF 1 MG/ML IJ SOLN
1.0000 mg | INTRAMUSCULAR | Status: DC | PRN
  Administered 2013-06-04 – 2013-06-06 (×3): 1 mg via INTRAVENOUS
  Filled 2013-06-03 (×3): qty 1

## 2013-06-03 MED ORDER — POLYVINYL ALCOHOL 1.4 % OP SOLN
1.0000 [drp] | Freq: Two times a day (BID) | OPHTHALMIC | Status: DC | PRN
  Filled 2013-06-03: qty 15

## 2013-06-03 MED ORDER — SODIUM CHLORIDE 0.9 % IV SOLN
INTRAVENOUS | Status: DC
  Administered 2013-06-04: 01:00:00 via INTRAVENOUS

## 2013-06-03 MED ORDER — ATORVASTATIN CALCIUM 10 MG PO TABS
10.0000 mg | ORAL_TABLET | Freq: Every day | ORAL | Status: DC
  Administered 2013-06-04 – 2013-06-07 (×4): 10 mg via ORAL
  Filled 2013-06-03 (×5): qty 1

## 2013-06-03 MED ORDER — INSULIN ASPART 100 UNIT/ML ~~LOC~~ SOLN
0.0000 [IU] | Freq: Three times a day (TID) | SUBCUTANEOUS | Status: DC
  Administered 2013-06-04: 13:00:00 3 [IU] via SUBCUTANEOUS
  Administered 2013-06-04: 09:00:00 5 [IU] via SUBCUTANEOUS
  Administered 2013-06-05 (×2): 2 [IU] via SUBCUTANEOUS
  Administered 2013-06-06: 3 [IU] via SUBCUTANEOUS
  Administered 2013-06-06 (×2): 1 [IU] via SUBCUTANEOUS
  Administered 2013-06-07: 2 [IU] via SUBCUTANEOUS
  Administered 2013-06-07 – 2013-06-08 (×2): 1 [IU] via SUBCUTANEOUS
  Administered 2013-06-08: 2 [IU] via SUBCUTANEOUS

## 2013-06-03 MED ORDER — HYDROMORPHONE HCL PF 1 MG/ML IJ SOLN
0.5000 mg | INTRAMUSCULAR | Status: DC | PRN
  Administered 2013-06-03: 0.5 mg via INTRAVENOUS
  Filled 2013-06-03: qty 1

## 2013-06-03 MED ORDER — HYDROCODONE-ACETAMINOPHEN 5-325 MG PO TABS
1.0000 | ORAL_TABLET | ORAL | Status: DC | PRN
  Administered 2013-06-04 (×2): 1 via ORAL
  Administered 2013-06-05 – 2013-06-07 (×5): 2 via ORAL
  Filled 2013-06-03 (×4): qty 2
  Filled 2013-06-03: qty 1
  Filled 2013-06-03: qty 2
  Filled 2013-06-03: qty 1

## 2013-06-03 MED ORDER — SODIUM CHLORIDE 0.9 % IV BOLUS (SEPSIS): 1000.0000 mL | Freq: Once | INTRAVENOUS | Status: DC
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
## 2013-06-03 NOTE — ED Notes (Signed)
Pt belongings consists of one black wallet with truliant visa, drivers licnese, social security card,VA ID, and numerous business cards, VIC card, [employer] card,[employer] card, [employer] card,[employer] advantage rewards card. Pt also has large Samsung phone in black case. Belongings verified with  [employer]. Given to security.

## 2013-06-03 NOTE — ED Notes (Signed)
Bed: WA20 Expected date:  Expected time:  Means of arrival:  Comments: EMS flank pain 
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
## 2013-06-04 ENCOUNTER — Encounter (HOSPITAL_COMMUNITY): Payer: Self-pay | Admitting: *Deleted

## 2013-06-04 DIAGNOSIS — I10 Essential (primary) hypertension: Secondary | ICD-10-CM

## 2013-06-04 DIAGNOSIS — E876 Hypokalemia: Secondary | ICD-10-CM

## 2013-06-04 DIAGNOSIS — R443 Hallucinations, unspecified: Secondary | ICD-10-CM

## 2013-06-04 DIAGNOSIS — F329 Major depressive disorder, single episode, unspecified: Secondary | ICD-10-CM

## 2013-06-04 DIAGNOSIS — F200 Paranoid schizophrenia: Secondary | ICD-10-CM

## 2013-06-04 DIAGNOSIS — E119 Type 2 diabetes mellitus without complications: Secondary | ICD-10-CM

## 2013-06-04 LAB — RAPID URINE DRUG SCREEN, HOSP PERFORMED
Amphetamines: NOT DETECTED
Barbiturates: NOT DETECTED
Benzodiazepines: NOT DETECTED
Cocaine: NOT DETECTED
Tetrahydrocannabinol: POSITIVE — AB

## 2013-06-04 LAB — BASIC METABOLIC PANEL
BUN: 8 mg/dL (ref 6–23)
Creatinine, Ser: 0.78 mg/dL (ref 0.50–1.35)
GFR calc Af Amer: >90 mL/min (ref >90)
GFR calc non Af Amer: >90 mL/min (ref >90)
Potassium: 3.3 mEq/L — ABNORMAL LOW (ref 3.5–5.1)

## 2013-06-04 LAB — CBC
Hemoglobin: 13.5 g/dL (ref 13.0–17.0)
MCHC: 35.1 g/dL (ref 30.0–36.0)
RDW: 13 % (ref 11.5–15.5)
WBC: 6.7 10*3/uL (ref 4.0–10.5)

## 2013-06-04 LAB — GLUCOSE, CAPILLARY
Glucose-Capillary: 100 mg/dL — ABNORMAL HIGH (ref 70–99)
Glucose-Capillary: 188 mg/dL — ABNORMAL HIGH (ref 70–99)

## 2013-06-04 LAB — HEMOGLOBIN A1C
Hgb A1c MFr Bld: 10.1 % — ABNORMAL HIGH (ref <5.7)
Mean Plasma Glucose: 243 mg/dL — ABNORMAL HIGH (ref <117)

## 2013-06-04 LAB — TSH: TSH: 1.745 u[IU]/mL (ref 0.350–4.500)

## 2013-06-04 MED ORDER — POTASSIUM CHLORIDE CRYS ER 20 MEQ PO TBCR
60.0000 meq | EXTENDED_RELEASE_TABLET | Freq: Once | ORAL | Status: AC
  Administered 2013-06-04: 60 meq via ORAL
  Filled 2013-06-04: qty 3

## 2013-06-04 NOTE — Evaluation (Signed)
Occupational Therapy Evaluation Patient Details Name: Bruce [name] MRN: [id number] DOB: 05/19/1946 Today's Date: 06/04/2013 Time: 4098-1191 OT Time Calculation (min): 25 min  OT Assessment / Plan / Recommendation History of present illness 67 yo male admitted with generalized weakness, fall, syncope.    Clinical Impression   Pt tolerated up with RW for functional mobility and then chair pulled up. Worked on LB ADL once in chair. He lives home alone so will benefit from OT services to maximize ADL independence for next venue.    OT Assessment  Patient needs continued OT Services    Follow Up Recommendations  SNF;Supervision/Assistance - 24 hour    Barriers to Discharge      Equipment Recommendations  3 in 1 bedside comode    Recommendations for Other Services    Frequency  Min 2X/week    Precautions / Restrictions Precautions Precautions: Fall Restrictions Weight Bearing Restrictions: No   Pertinent Vitals/Pain Some discomfort with L shoulder ROM (premorbid shoulder issues). See doc flow for BP taken during session.    ADL  Eating/Feeding: Simulated;Independent Where Assessed - Eating/Feeding: Bed level Grooming: Simulated;Wash/dry hands;Set up Where Assessed - Grooming: Supine, head of bed up Upper Body Bathing: Simulated;Chest;Right arm;Left arm;Abdomen;Set up;Supervision/safety Where Assessed - Upper Body Bathing: Unsupported sitting Lower Body Bathing: Simulated;Moderate assistance Where Assessed - Lower Body Bathing: Supported sit to stand Upper Body Dressing: Simulated;Minimal assistance Where Assessed - Upper Body Dressing: Unsupported sitting Lower Body Dressing: Simulated;Moderate assistance Where Assessed - Lower Body Dressing: Supported sit to stand Toilet Transfer: Simulated;+2 Total assistance Toilet Transfer: Patient Percentage: 80% (with functional mobility, chair pulled up) Toileting - Clothing Manipulation and Hygiene: Simulated;Moderate  assistance Where Assessed - Toileting Clothing Manipulation and Hygiene: Standing Equipment Used: Rolling walker;Gait belt ADL Comments: Pt states he feels a little nauseous but tolerated up to ambulate in hallway (see PT note) and then to the chair for LB ADLs.     OT Diagnosis: Generalized weakness  OT Problem List: Decreased strength;Decreased knowledge of use of DME or AE OT Treatment Interventions: Self-care/ADL training;DME and/or AE instruction;Patient/family education;Therapeutic activities   OT Goals(Current goals can be found in the care plan section) Acute Rehab OT Goals Patient Stated Goal: less pain.  OT Goal Formulation: With patient Time For Goal Achievement: 06/18/13 Potential to Achieve Goals: Good  Visit Information  Last OT Received On: 06/04/13 Assistance Needed: +2 History of Present Illness: 67 yo male admitted with generalized weakness, fall, syncope.        Prior Functioning     Home Living Family/patient expects to be discharged to:: Private residence Living Arrangements: Alone Available Help at Discharge: Family;Available PRN/intermittently Type of Home: Apartment Home Access: Stairs to enter Entrance Stairs-Number of Steps: 2+2 Entrance Stairs-Rails: None Home Layout: Two level;Able to live on main level with bedroom/bathroom Home Equipment: Tub bench;Walker - 2 wheels;Cane - single point Additional Comments: pt reports having a tubbench that is still in the box Prior Function Level of Independence: Independent with assistive device(s) Communication Communication: No difficulties         Vision/Perception Vision - History Baseline Vision:  (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)   Cognition  Cognition Arousal/Alertness: Awake/alert Behavior During Therapy: WFL for tasks assessed/performed Overall Cognitive Status: Within Functional Limits for tasks assessed    Extremity/Trunk Assessment Upper Extremity  Assessment Upper Extremity Assessment: LUE deficits/detail LUE Deficits / Details: AROM grossly WFL but slow to raise UE over head; pt states history of torn  rotator cuff/injury that hasnt been operated on; elbow wrist and hand grossly [facility] Lower Extremity Assessment Lower Extremity Assessment: Generalized weakness Cervical / Trunk Assessment Cervical / Trunk Assessment: Kyphotic     Mobility Bed Mobility Bed Mobility: Supine to Sit Supine to Sit: 4: Min guard;HOB elevated Details for Bed Mobility Assistance: Increased time.  Transfers Sit to Stand: 4: Min assist;From bed Stand to Sit: 4: Min assist;To chair/3-in-1 Details for Transfer Assistance: Assist to rise, stabilize, control descent     Exercise     Balance     End of Session OT - End of Session Equipment Utilized During Treatment: Gait belt Activity Tolerance: Patient tolerated treatment well Patient left: in chair;with call bell/phone within reach  GO     Lennox Laity [phone] 06/04/2013, 1:01 PM

## 2013-06-04 NOTE — Evaluation (Signed)
Physical Therapy Evaluation Patient Details Name: Bruce [name] MRN: [id number] DOB: 1946/06/22 Today's Date: 06/04/2013 Time: 1191-4782 PT Time Calculation (min): 23 min  PT Assessment / Plan / Recommendation History of Present Illness  67 yo male admitted with generalized weakness, fall, syncope.   Clinical Impression  On eval, pt required +2 assist for safe mobility-able to ambulate ~75 feet with RW. Demonstrates general weakness and decreased activity tolerance. Recommend ST rehab at SNF at this time, if pt is agreeable.     PT Assessment  Patient needs continued PT services    Follow Up Recommendations  SNF    Does the patient have the potential to tolerate intense rehabilitation      Barriers to Discharge        Equipment Recommendations  None recommended by PT    Recommendations for Other Services OT consult   Frequency Min 3X/week    Precautions / Restrictions Precautions Precautions: Fall Restrictions Weight Bearing Restrictions: No   Pertinent Vitals/Pain 7/10 chest/abdomen      Mobility  Bed Mobility Bed Mobility: Supine to Sit Supine to Sit: 4: Min guard;HOB elevated Details for Bed Mobility Assistance: Increased time.  Transfers Transfers: Sit to Stand;Stand to Sit Sit to Stand: 4: Min assist;From bed Stand to Sit: 4: Min assist;To chair/3-in-1 Details for Transfer Assistance: Assist to rise, stabilize, control descent Ambulation/Gait Ambulation/Gait Assistance: 1: +2 Total assist Ambulation/Gait: Patient Percentage: 80% Ambulation Distance (Feet): 75 Feet Assistive device: Rolling walker Ambulation/Gait Assistance Details: unsteady. bil knee buckling towards end of distance. followed with recliner. assist to stabilize throughout ambulation Gait Pattern: Step-through pattern;Decreased stride length;Trunk flexed    Exercises     PT Diagnosis: Difficulty walking;Generalized weakness  PT Problem List: Decreased strength;Decreased activity  tolerance;Decreased mobility;Decreased knowledge of use of DME;Decreased balance;Pain PT Treatment Interventions: DME instruction;Gait training;Functional mobility training;Therapeutic activities;Therapeutic exercise;Patient/family education     PT Goals(Current goals can be found in the care plan section) Acute Rehab PT Goals Patient Stated Goal: less pain.  PT Goal Formulation: With patient Time For Goal Achievement: 06/18/13 Potential to Achieve Goals: Good  Visit Information  Last PT Received On: 06/04/13 Assistance Needed: +2 (safety) PT/OT Co-Evaluation/Treatment: Yes History of Present Illness: 67 yo male admitted with generalized weakness, fall, syncope.        Prior Functioning  Home Living Family/patient expects to be discharged to:: Private residence Living Arrangements: Alone Available Help at Discharge: Family;Available PRN/intermittently Type of Home: Apartment Home Access: Stairs to enter Entrance Stairs-Number of Steps: 2+2 Entrance Stairs-Rails: None Home Layout: Two level;Able to live on main level with bedroom/bathroom Prior Function Level of Independence: Independent with assistive device(s) Communication Communication: No difficulties    Cognition  Cognition Arousal/Alertness: Awake/alert Behavior During Therapy: WFL for tasks assessed/performed Overall Cognitive Status: Within Functional Limits for tasks assessed    Extremity/Trunk Assessment Upper Extremity Assessment Upper Extremity Assessment: Generalized weakness Lower Extremity Assessment Lower Extremity Assessment: Generalized weakness Cervical / Trunk Assessment Cervical / Trunk Assessment: Kyphotic   Balance    End of Session PT - End of Session Equipment Utilized During Treatment: Gait belt Activity Tolerance: Patient limited by fatigue;Patient limited by pain Patient left: in chair;with call bell/phone within reach  GP     Rebeca Alert, MPT Pager: [phone]

## 2013-06-04 NOTE — Progress Notes (Signed)
[facility] PROGRESS NOTE  Bruce [name] JYN:[id number] DOB: 1946-01-26 DOA: 06/03/2013 PCP: Pcp Not In System  Assessment/Plan: Abdominal pain -Lipase within normal limits and CT scan of abdomen and pelvis shows nonobstructive left kidney stone and  no acute abnormalities -Urinalysis with hematuria and status post in and out cath but otherwise negative for infection -Continue pain management, follow. Schizophrenia, paranoid type with visual hallucinations  -I. have consulted psychiatry for further recommendations Generalized weakness  - likely secondary to progressive deconditioning, ? depression  -  UDS + for marijuana and opiates -social work consult for substance/marijuana abuse counseling, TSH within normal limits at 1.745, and troponin negative -  PT/OT evaluation, recommending SNF  Active Problems:  Type 2 diabetes mellitus  - continue home medical regimen Insulin 70/30 25 units BID - will also add SSI and meal coverage 4 units  - check A1C  Hypertension  - ok BP control, follow - will continue home medical regimen  Hyperlipidemia  - continue statin  Depression  - Psych consulted for further  hypokalemia -Replace K Code Status: full Family Communication: none at bedside Disposition Plan: Likely to SNF when medically ready   Consultants:  Psychiatry-eval pending  Procedures:  none  Antibiotics:  none  HPI/Subjective:  patient complaining of left sided abdominal pain, positive nausea but no vomiting.+ Visual hallucinations  Objective: Filed Vitals:   06/04/13 1458  BP: 123/57  Pulse: 74  Temp: 97.6 F (36.4 C)  Resp: 20    Intake/Output Summary (Last 24 hours) at 06/04/13 1805 Last data filed at 06/04/13 1514  Gross per 24 hour  Intake 1315.42 ml  Output    975 ml  Net 340.42 ml   Filed Weights   06/04/13 0444  Weight: 79.516 kg (175 lb 4.8 oz)   Exam:  General: alert & oriented x S In NAD Cardiovascular: RRR, nl S1 s2 Respiratory:  CTAB Abdomen: soft +Bs, mild left upper quadrant tenderness, ND, no masses palpable Extremities: No cyanosis and no edema       Data Reviewed: Basic Metabolic Panel:  Recent Labs Lab 06/03/13 1745 06/04/13 0409  NA 133* 136  K 3.6 3.3*  CL 96 104  CO2 22 24  GLUCOSE 362* 260*  BUN 9 8  CREATININE 0.64 0.78  CALCIUM 9.9 8.6   Liver Function Tests:  Recent Labs Lab 06/03/13 1745  AST 24  ALT 24  ALKPHOS 90  BILITOT 0.4  PROT 7.2  ALBUMIN 4.0    Recent Labs Lab 06/03/13 1745  LIPASE 56   No results found for this basename: AMMONIA,  in the last 168 hours CBC:  Recent Labs Lab 06/03/13 1745 06/04/13 0409  WBC 9.1 6.7  NEUTROABS 6.9  --   HGB 15.5 13.5  HCT 42.6 38.5*  MCV 87.3 89.1  PLT 159 123*   Cardiac Enzymes:  Recent Labs Lab 06/03/13 2045 06/03/13 2355  CKTOTAL 109 103   BNP (last 3 results)  Recent Labs  02/28/13 0316 03/12/13 1729  PROBNP 24.5 55.6   CBG:  Recent Labs Lab 06/03/13 2226 06/04/13 0718 06/04/13 1127 06/04/13 1655  GLUCAP 186* 265* 213* 100*    No results found for this or any previous visit (from the past 240 hour(s)).   Studies: Dg Thoracic Spine 2 View  06/03/2013   CLINICAL DATA:  Back pain.  Abdominal pain.  EXAM: THORACIC SPINE - 2 VIEW  COMPARISON:  No priors.  FINDINGS: AP and lateral views of the thoracic spine demonstrate  no definite acute displaced fractures or compression type fractures. Multilevel degenerative disc disease is noted. Alignment appears anatomic allowing for patient's rotation to the right.  IMPRESSION: No acute radiographic abnormality of the thoracic spine.   Electronically Signed   By: Trudie Reed M.D.   On: 06/03/2013 18:43   Dg Lumbar Spine Complete  06/03/2013   CLINICAL DATA:  Low back pain.  EXAM: LUMBAR SPINE - COMPLETE 4+ VIEW  COMPARISON:  CT of the abdomen and pelvis 02/28/2013.  FINDINGS: Five views of the lumbar spine demonstrate no definite acute displaced fracture  or compression type fracture of the lumbar spine. Alignment is anatomic. Multilevel degenerative disc disease, most severe at L1-L2 and L5-S1. Multilevel facet arthropathy, most pronounced at L4-L5 and L5-S1. No defects of the pars interarticularis are noted.  IMPRESSION: 1. No acute radiographic abnormality of the lumbar spine. 2. 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   Electronically Signed   By: Trudie Reed M.D.   On: 06/03/2013 18:44   Ct Abdomen Pelvis W Contrast  06/03/2013   CLINICAL DATA:  Left flank pain, dysuria.  EXAM: CT ABDOMEN AND PELVIS WITH CONTRAST  TECHNIQUE: Multidetector CT imaging of the abdomen and pelvis was performed using the standard protocol following bolus administration of intravenous contrast.  CONTRAST:  OMNIPAQUE IOHEXOL 300 MG/ML  SOLN  COMPARISON:  02/28/2013  FINDINGS: Dependent atelectasis in the visualized lung bases. Patchy coronary calcifications. Surgical clips in the gallbladder fossa. Unremarkable liver, spleen, adrenal glands, right kidney, pancreas. 2mm calculus in the upper pole left kidney without hydronephrosis. Patchy aortoiliac plaque without aneurysm or stenosis. Portal vein patent. Stomach, small bowel, and colon nondilated. Urinary bladder physiologically distended. There is marked prostatic enlargement. No ascites. No free air. No adenopathy localized. Multilevel spondylitic changes of the lower thoracic and lumbar spine most marked L1-2 and L5-S1.  IMPRESSION: No acute abdominal process.  Left nephrolithiasis without hydronephrosis as before.  Marked prostatic enlargement.  Atherosclerosis, including aortoiliac and coronary artery disease. Please note that although the presence of coronary artery calcium documents the presence of coronary artery disease, the severity of this disease and any potential stenosis cannot be assessed on this non-gated CT examination. Assessment for potential risk factor modification,  dietary therapy or pharmacologic therapy may be warranted, if clinically indicated.   Electronically Signed   By: Oley Balm M.D.   On: 06/03/2013 18:59   Dg Chest Portable 1 View  06/03/2013   CLINICAL DATA:  Abdominal pain  EXAM: PORTABLE CHEST - 1 VIEW  COMPARISON:  03/12/2013  FINDINGS: Low lung volumes with resulting crowding of bronchovascular structures. Heart size upper limits normal. No effusion. Regional bones grossly unremarkable. Surgical clips in the right upper abdomen.  IMPRESSION: No active disease.   Electronically Signed   By: Oley Balm M.D.   On: 06/03/2013 20:23    Scheduled Meds: . aspirin  81 mg Oral QHS  . atorvastatin  10 mg Oral q1800  . chlorhexidine  15 mL Mouth/Throat QHS  . clopidogrel  75 mg Oral QHS  . dorzolamide  1 drop Both Eyes QHS  . enoxaparin (LOVENOX) injection  40 mg Subcutaneous QHS  . gabapentin  600 mg Oral QHS  . insulin aspart  0-9 Units Subcutaneous TID WC  . insulin aspart  4 Units Subcutaneous TID WC  . insulin aspart protamine- aspart  25 Units Subcutaneous BID WC  . isosorbide mononitrate  15 mg Oral QHS  . latanoprost  1  drop Left Eye QHS  . metoprolol tartrate  12.5 mg Oral QHS  . pantoprazole  40 mg Oral Daily  . sertraline  12.5 mg Oral QHS  . sodium chloride  1,000 mL Intravenous Once  . sodium chloride  3 mL Intravenous Q12H  . sucralfate  1 g Oral Daily  . terazosin  5 mg Oral QHS   Continuous Infusions: . sodium chloride 50 mL/hr at 06/04/13 0035  . sodium chloride 1,000 mL (06/04/13 1109)    Principal Problem:   Generalized weakness Active Problems:   Hypertension   Type 2 diabetes mellitus   Schizophrenia, paranoid type   Hyperlipidemia   Depression    Time spent: 40    [facility] C  [facility] Pager [phone]. If 7PM-7AM, please contact night-coverage at [email or web address], password [facility] 06/04/2013, 6:05 PM  LOS: 1 day

## 2013-06-04 NOTE — Progress Notes (Signed)
Patient stated to RN and Director Unit "my family and sister Karena Addison can get information about me over the phone."  RN added patient's sister, Karena Addison, to emergency contacts as patient requested.

## 2013-06-04 NOTE — Progress Notes (Signed)
Inpatient Diabetes Program Recommendations  AACE/ADA: New Consensus Statement on Inpatient Glycemic Control (2013)  Target Ranges:  Prepandial:   less than 140 mg/dL      Peak postprandial:   less than 180 mg/dL (1-2 hours)      Critically ill patients:  140 - 180 mg/dL   Reason for Visit: Hyperglycemia  Pt states he checks blood sugars at home 2x/day and MD at the [facility] has recently changed his insulin regimen to 70/30 25 units in am and 29 units in pm, along with s/s. Said blood sugars have been uncontrolled for past week with no changes in diet.   Results for LEUL, NARRAMORE (MRN [id number]) as of 06/04/2013 12:45  Ref. Range 06/03/2013 22:26 06/04/2013 07:18 06/04/2013 11:27  Glucose-Capillary Latest Range: 70-99 mg/dL 409 (H) 811 (H) 914 (H)   Results for KENTO, GOSSMAN (MRN [id number]) as of 06/04/2013 12:45  Ref. Range 06/03/2013 23:55  Hemoglobin A1C Latest Range: <5.7 % 10.1 (H)   HgbA1C indicative of sub-optimal control at home. Eating approx 75% of meals.  Inpatient Diabetes Program Recommendations Insulin - Basal: Increase 70/30 to 30 units bid Encouraged pt to f/u with PCP at [facility] and take logbook of blood sugars for review and adjustments.  Will continue to follow. Thank you. Ailene Ards, RD, LDN, CDE Inpatient Diabetes Coordinator [phone]

## 2013-06-05 DIAGNOSIS — F4310 Post-traumatic stress disorder, unspecified: Secondary | ICD-10-CM

## 2013-06-05 DIAGNOSIS — F259 Schizoaffective disorder, unspecified: Secondary | ICD-10-CM

## 2013-06-05 DIAGNOSIS — F1210 Cannabis abuse, uncomplicated: Secondary | ICD-10-CM

## 2013-06-05 LAB — BASIC METABOLIC PANEL
CO2: 26 mEq/L (ref 19–32)
Calcium: 8.5 mg/dL (ref 8.4–10.5)
GFR calc Af Amer: >90 mL/min (ref >90)
GFR calc non Af Amer: >90 mL/min (ref >90)
Sodium: 137 mEq/L (ref 135–145)

## 2013-06-05 LAB — GLUCOSE, CAPILLARY: Glucose-Capillary: 160 mg/dL — ABNORMAL HIGH (ref 70–99)

## 2013-06-05 MED ORDER — RISPERIDONE 0.25 MG PO TABS
0.2500 mg | ORAL_TABLET | Freq: Every day | ORAL | Status: DC
  Administered 2013-06-05 – 2013-06-07 (×3): 0.25 mg via ORAL
  Filled 2013-06-05 (×4): qty 1

## 2013-06-05 NOTE — Progress Notes (Signed)
Clinical Social Work Department BRIEF PSYCHOSOCIAL ASSESSMENT 06/05/2013  Patient:  Bruce [name], Bruce [name]     Account Number:  [id number]     Admit date:  06/03/2013  Clinical Social Worker:  Orpah Greek  Date/Time:  06/05/2013 04:06 PM  Referred by:  Physician  Date Referred:  06/05/2013 Referred for  SNF Placement   Other Referral:   Interview type:  Patient Other interview type:    PSYCHOSOCIAL DATA Living Status:  ALONE Admitted from facility:   Level of care:   Primary support name:  Isiaih Hollenbach (son) ph#: [phone] Primary support relationship to patient:  CHILD, ADULT Degree of support available:   good    CURRENT CONCERNS Current Concerns  Post-Acute Placement   Other Concerns:    SOCIAL WORK ASSESSMENT / PLAN CSW spoke with patient re: discharge plans - note PT recommended SNF for patient at discharge.   Assessment/plan status:  Information/Referral to [facility] Other assessment/ plan:   Information/referral to community resources:   CSW completed FL2 and faxed information out to [facility] - provided bed offers.    PATIENT'S/FAMILY'S RESPONSE TO PLAN OF CARE: Patient is agreeable with plan for SNF - will look over bed offers & research on his phone over the weekend. CSW will follow-up Monday with SNF decision.       Unice Bailey, LCSW [facility] Clinical Social Worker cell #: [phone]
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
## 2013-06-05 NOTE — Progress Notes (Signed)
Physical Therapy Treatment Patient Details Name: Bruce [name] MRN: [id number] DOB: July 07, 1946 Today's Date: 06/05/2013 Time: 6962-9528 PT Time Calculation (min): 28 min  PT Assessment / Plan / Recommendation       PT Comments   Pt in bed on 2 lts at 100% so removed O2.  Assisted OOB to amb in hallway with RW.  Very unsteady gait with excessive lean on walker and difficulty advancing R LE with noted foot drag.  Pt demon limited activity tolerance and 3/4 DOE.  Requires increased time and freq rest breaks as she c/o "some" SOB.   Follow Up Recommendations  SNF     Does the patient have the potential to tolerate intense rehabilitation     Barriers to Discharge        Equipment Recommendations       Recommendations for Other Services    Frequency Min 3X/week   Progress towards PT Goals Progress towards PT goals: Progressing toward goals  Plan      Precautions / Restrictions Precautions Precautions: Fall Restrictions Weight Bearing Restrictions: No    Pertinent Vitals/Pain No c/o pain    Mobility  Bed Mobility Bed Mobility: Supine to Sit;Sit to Supine Supine to Sit: 5: Supervision Sit to Supine: 5: Supervision Details for Bed Mobility Assistance: Increased time.  Transfers Transfers: Sit to Stand;Stand to Sit Sit to Stand: 4: Min assist;From bed Stand to Sit: 4: Min assist;To chair/3-in-1 Details for Transfer Assistance: 50% VC's on proper hand placement NOT to pull self up on walker.   Ambulation/Gait Ambulation/Gait Assistance: 3: Mod assist;2: Max assist Ambulation Distance (Feet): 125 Feet (x 2 sitting rest breaks) Assistive device: Rolling walker Ambulation/Gait Assistance Details: Unsteady with B knees buckling and 75% VC's on proper walker to self distance and upright posture.  Gait Pattern: Step-through pattern;Decreased stride length;Trunk flexed Gait velocity: decreased     PT Goals (current goals can now be found in the care plan section)    Visit  Information  Last PT Received On: 06/05/13 Assistance Needed: +2 (for amb safety)    Subjective Data      Cognition       Balance     End of Session PT - End of Session Equipment Utilized During Treatment: Gait belt Activity Tolerance: Patient limited by fatigue Patient left: in bed;with call bell/phone within reach   Felecia Shelling  PTA [facility]  Acute  Rehab Pager      [phone]

## 2013-06-05 NOTE — Progress Notes (Signed)
Clinical Social Work Department CLINICAL SOCIAL WORK PLACEMENT NOTE 06/05/2013  Patient:  Bruce [name], Bruce [name]  Account Number:  [id number] Admit date:  06/03/2013  Clinical Social Worker:  Orpah Greek  Date/time:  06/05/2013 04:08 PM  Clinical Social Work is seeking post-discharge placement for this patient at the following level of care:   SKILLED NURSING   (*CSW will update this form in Epic as items are completed)   06/05/2013  Patient/family provided with [facility] Department of Clinical Social Work's list of facilities offering this level of care within the geographic area requested by the patient (or if unable, by the patient's family).  06/05/2013  Patient/family informed of their freedom to choose among providers that offer the needed level of care, that participate in Medicare, Medicaid or managed care program needed by the patient, have an available bed and are willing to accept the patient.  06/05/2013  Patient/family informed of [facility]' ownership interest in [facility], as well as of the fact that they are under no obligation to receive care at this facility.  PASARR submitted to EDS on 06/05/2013 PASARR number received from EDS on 06/05/2013  FL2 transmitted to all facilities in geographic area requested by pt/family on  06/05/2013 FL2 transmitted to all facilities within larger geographic area on   Patient informed that his/her managed care company has contracts with or will negotiate with  certain facilities, including the following:     Patient/family informed of bed offers received:  06/05/2013 Patient chooses bed at  Physician recommends and patient chooses bed at    Patient to be transferred to  on   Patient to be transferred to facility by   The following physician request were entered in Epic:   Additional Comments:   Unice Bailey, LCSW [facility] Clinical Social Worker cell #: [phone]

## 2013-06-05 NOTE — Progress Notes (Signed)
[facility] PROGRESS NOTE  Bruce [name]:[id number] DOB: [birth year] DOA: 06/03/2013 PCP: Pcp Not In System  Assessment/Plan: Abdominal pain -Lipase within normal limits and CT scan of abdomen and pelvis shows nonobstructive left kidney stone and no acute abnormalities -Urinalysis with hematuria and status post in and out cath but otherwise negative for infection -Continue pain management, follow. 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 depression  -  UDS + for marijuana and opiates -social work consult for substance/marijuana abuse counseling, TSH within normal limits at 1.745, and troponin negative -  PT/OT recommending SNF  Active Problems:  Type 2 diabetes mellitus  - continue home medical regimen Insulin 70/30 25 units BID - will also add SSI and meal coverage 4 units  - check A1C  Hypertension  - ok BP control, follow - will continue home medical regimen  Hyperlipidemia  - continue statin  Depression  - Psych consulted for further  hypokalemia -Replace K Code Status: full Family Communication: none at bedside Disposition Plan: Likely to SNF when medically ready   Consultants:  Psychiatry  Procedures:  none  Antibiotics:  none  HPI/Subjective: pt states still with some abdominal pain, narrates this problems with PTSD and the difficulties he has had getting services at the [place].  Objective: Filed Vitals:   06/05/13 1450  BP: 121/78  Pulse: 75  Temp: 98 F (36.7 C)  Resp: 18    Intake/Output Summary (Last 24 hours) at 06/05/13 1610 Last data filed at 06/05/13 1300  Gross  per 24 hour  Intake   2840 ml  Output   1050 ml  Net   1790 ml   Filed Weights   06/04/13 0444 06/05/13 0445  Weight: 79.516 kg (175 lb 4.8 oz) 82.5 kg (181 lb 14.1 oz)   Exam:  General: alert & oriented x 3 In NAD Cardiovascular: RRR, nl S1 s2 Respiratory: CTAB Abdomen: soft +Bs, mild left upper quadrant tenderness, ND, no masses palpable Extremities: No cyanosis and no edema       Data Reviewed: Basic Metabolic Panel:  Recent Labs Lab 06/03/13 1745 06/04/13 0409 06/05/13 0402  NA 133* 136 137  K 3.6 3.3* 3.6  CL 96 104 106  CO2 22 24 26   GLUCOSE 362* 260* 152*  BUN 9 8 7   CREATININE 0.64 0.78 0.82  CALCIUM 9.9 8.6 8.5   Liver Function Tests:  Recent Labs Lab 06/03/13 1745  AST 24  ALT 24  ALKPHOS 90  BILITOT 0.4  PROT 7.2  ALBUMIN 4.0    Recent Labs Lab 06/03/13 1745  LIPASE 56   No results found for this basename: AMMONIA,  in the last 168 hours CBC:  Recent Labs Lab 06/03/13 1745 06/04/13 0409  WBC 9.1 6.7  NEUTROABS 6.9  --   HGB 15.5 13.5  HCT 42.6 38.5*  MCV 87.3 89.1  PLT 159 123*   Cardiac Enzymes:  Recent Labs Lab 06/03/13 2045 06/03/13 2355  CKTOTAL 109 103   BNP (last 3 results)  Recent Labs  02/28/13 0316 03/12/13 1729  PROBNP 24.5 55.6   CBG:  Recent Labs Lab 06/04/13 1127 06/04/13 1655 06/04/13 2224 06/05/13 0726 06/05/13  1137  GLUCAP 213* 100* 188* 160* 78    No results found for this or any previous visit (from the past 240 hour(s)).   Studies: Dg Thoracic Spine 2 View  06/03/2013   CLINICAL DATA:  Back pain.  Abdominal pain.  EXAM: THORACIC SPINE - 2 VIEW  COMPARISON:  No priors.  FINDINGS: AP and lateral views of the thoracic spine demonstrate no definite acute displaced fractures or compression type fractures. Multilevel degenerative disc disease is noted. Alignment appears anatomic allowing for patient's rotation to the right.  IMPRESSION: No acute radiographic abnormality of the thoracic spine.    Electronically Signed   By: Trudie Reed M.D.   On: 06/03/2013 18:43   Dg Lumbar Spine Complete  06/03/2013   CLINICAL DATA:  Low back pain.  EXAM: LUMBAR SPINE - COMPLETE 4+ VIEW  COMPARISON:  CT of the abdomen and pelvis 02/28/2013.  FINDINGS: Five views of the lumbar spine demonstrate no definite acute displaced fracture or compression type fracture of the lumbar spine. Alignment is anatomic. Multilevel degenerative disc disease, most severe at L1-L2 and L5-S1. Multilevel facet arthropathy, most pronounced at L4-L5 and L5-S1. No defects of the pars interarticularis are noted.  IMPRESSION: 1. No acute radiographic abnormality of the lumbar spine. 2. 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   Electronically Signed   By: Trudie Reed M.D.   On: 06/03/2013 18:44   Ct Abdomen Pelvis W Contrast  06/03/2013   CLINICAL DATA:  Left flank pain, dysuria.  EXAM: CT ABDOMEN AND PELVIS WITH CONTRAST  TECHNIQUE: Multidetector CT imaging of the abdomen and pelvis was performed using the standard protocol following bolus administration of intravenous contrast.  CONTRAST:  OMNIPAQUE IOHEXOL 300 MG/ML  SOLN  COMPARISON:  02/28/2013  FINDINGS: Dependent atelectasis in the visualized lung bases. Patchy coronary calcifications. Surgical clips in the gallbladder fossa. Unremarkable liver, spleen, adrenal glands, right kidney, pancreas. 2mm calculus in the upper pole left kidney without hydronephrosis. Patchy aortoiliac plaque without aneurysm or stenosis. Portal vein patent. Stomach, small bowel, and colon nondilated. Urinary bladder physiologically distended. There is marked prostatic enlargement. No ascites. No free air. No adenopathy localized. Multilevel spondylitic changes of the lower thoracic and lumbar spine most marked L1-2 and L5-S1.  IMPRESSION: No acute abdominal process.  Left nephrolithiasis without hydronephrosis as before.  Marked prostatic enlargement.  Atherosclerosis,  including aortoiliac and coronary artery disease. Please note that although the presence of coronary artery calcium documents the presence of coronary artery disease, the severity of this disease and any potential stenosis cannot be assessed on this non-gated CT examination. Assessment for potential risk factor modification, dietary therapy or pharmacologic therapy may be warranted, if clinically indicated.   Electronically Signed   By: Oley Balm M.D.   On: 06/03/2013 18:59   Dg Chest Portable 1 View  06/03/2013   CLINICAL DATA:  Abdominal pain  EXAM: PORTABLE CHEST - 1 VIEW  COMPARISON:  03/12/2013  FINDINGS: Low lung volumes with resulting crowding of bronchovascular structures. Heart size upper limits normal. No effusion. Regional bones grossly unremarkable. Surgical clips in the right upper abdomen.  IMPRESSION: No active disease.   Electronically Signed   By: Oley Balm M.D.   On: 06/03/2013 20:23    Scheduled Meds: . aspirin  81 mg Oral QHS  . atorvastatin  10 mg Oral q1800  . chlorhexidine  15 mL Mouth/Throat QHS  . clopidogrel  75 mg Oral QHS  . dorzolamide  1  drop Both Eyes QHS  . enoxaparin (LOVENOX) injection  40 mg Subcutaneous QHS  . gabapentin  600 mg Oral QHS  . insulin aspart  0-9 Units Subcutaneous TID WC  . insulin aspart  4 Units Subcutaneous TID WC  . insulin aspart protamine- aspart  25 Units Subcutaneous BID WC  . isosorbide mononitrate  15 mg Oral QHS  . latanoprost  1 drop Left Eye QHS  . metoprolol tartrate  12.5 mg Oral QHS  . pantoprazole  40 mg Oral Daily  . risperiDONE  0.25 mg Oral QHS  . sertraline  12.5 mg Oral QHS  . sodium chloride  1,000 mL Intravenous Once  . sodium chloride  3 mL Intravenous Q12H  . sucralfate  1 g Oral Daily  . terazosin  5 mg Oral QHS   Continuous Infusions: . sodium chloride 50 mL/hr at 06/04/13 0035  . sodium chloride 1,000 mL (06/05/13 1149)    Principal Problem:   Generalized weakness Active Problems:    Hypertension   Type 2 diabetes mellitus   Schizophrenia, paranoid type   Hyperlipidemia   Depression    Time spent: 106    [facility]  [facility] Pager [phone]. If 7PM-7AM, please contact night-coverage at [email or web address], password [facility] 06/05/2013, 4:10 PM  LOS: 2 days
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
## 2013-06-05 NOTE — Progress Notes (Addendum)
Assumed care of pt at this time. Agree with previously charted assessment. Denies needs or questions at this time.  Will monitor.  Ardyth Gal, RN 06/05/2013

## 2013-06-06 DIAGNOSIS — R5381 Other malaise: Secondary | ICD-10-CM

## 2013-06-06 LAB — GLUCOSE, CAPILLARY
Glucose-Capillary: 126 mg/dL — ABNORMAL HIGH (ref 70–99)
Glucose-Capillary: 131 mg/dL — ABNORMAL HIGH (ref 70–99)

## 2013-06-06 MED ORDER — TAMSULOSIN HCL 0.4 MG PO CAPS
0.4000 mg | ORAL_CAPSULE | Freq: Every day | ORAL | Status: DC
  Administered 2013-06-06 – 2013-06-08 (×3): 0.4 mg via ORAL
  Filled 2013-06-06 (×4): qty 1

## 2013-06-06 MED ORDER — INFLUENZA VAC SPLIT QUAD 0.5 ML IM SUSP
0.5000 mL | Freq: Once | INTRAMUSCULAR | Status: AC
  Administered 2013-06-06: 18:00:00 0.5 mL via INTRAMUSCULAR
  Filled 2013-06-06 (×2): qty 0.5

## 2013-06-06 NOTE — Progress Notes (Signed)
[facility] PROGRESS NOTE  Bruce [name] MWN:[id number] DOB: 02-Mar-1946 DOA: 06/03/2013 PCP: Pcp Not In System  Assessment/Plan: Abdominal pain -Lipase within normal limits and CT scan of abdomen and pelvis shows nonobstructive left kidney stone and no acute abnormalities -Urinalysis with hematuria and status post in and out cath but otherwise negative for infection -Continue pain management, follow. 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 depression  -  UDS + for marijuana and opiates -social work consult for substance/marijuana abuse counseling, TSH within normal limits at 1.745, and troponin negative -  PT/OT recommending SNF  Active Problems:  Type 2 diabetes mellitus  - continue home medical regimen Insulin 70/30 25 units BID - continue SSI and meal coverage 4 units  - poorly controlled A1C 10.1 Hypertension  - ok BP control, follow - will continue home medical regimen  Hyperlipidemia  - continue statin  Depression  - continue zoloft  hypokalemia -Resolved K urinary retention -s/p I/O cath last pm -continue terazosin, will add folmax and follow -may require indwelling foley if continues to recur Hematuria -secondary to I/O cath last pm, follow Code Status: full Family Communication: none at bedside Disposition Plan: Likely to SNF when medically ready   Consultants:  Psychiatry  Procedures:  none  Antibiotics:  none  HPI/Subjective: Somnolent s/p dilaudid for shoulder pain this am>> aroused>> denies new c/o  Objective: Filed Vitals:   06/06/13 1313  BP: 123/49  Pulse: 63   Temp: 98.6 F (37 C)  Resp: 16    Intake/Output Summary (Last 24 hours) at 06/06/13 1725 Last data filed at 06/06/13 1500  Gross per 24 hour  Intake   2220 ml  Output   2675 ml  Net   -455 ml   Filed Weights   06/04/13 0444 06/05/13 0445  Weight: 79.516 kg (175 lb 4.8 oz) 82.5 kg (181 lb 14.1 oz)   Exam:  General: somnolent but arousable  & oriented x 3 In NAD Cardiovascular: RRR, nl S1 s2 Respiratory: CTAB Abdomen: soft +Bs, mild left upper quadrant tenderness, ND, no masses palpable Extremities: No cyanosis and no edema       Data Reviewed: Basic Metabolic Panel:  Recent Labs Lab 06/03/13 1745 06/04/13 0409 06/05/13 0402  NA 133* 136 137  K 3.6 3.3* 3.6  CL 96 104 106  CO2 22 24 26   GLUCOSE 362* 260* 152*  BUN 9 8 7   CREATININE 0.64 0.78 0.82  CALCIUM 9.9 8.6 8.5   Liver Function Tests:  Recent Labs Lab 06/03/13 1745  AST 24  ALT 24  ALKPHOS 90  BILITOT 0.4  PROT 7.2  ALBUMIN 4.0    Recent Labs Lab 06/03/13 1745  LIPASE 56   No results found for this basename: AMMONIA,  in the last 168 hours CBC:  Recent Labs Lab 06/03/13 1745 06/04/13 0409  WBC 9.1 6.7  NEUTROABS 6.9  --   HGB 15.5 13.5  HCT 42.6 38.5*  MCV 87.3 89.1  PLT 159 123*   Cardiac Enzymes:  Recent Labs Lab 06/03/13 2045 06/03/13 2355  CKTOTAL 109 103   BNP (last 3 results)  Recent Labs  02/28/13 0316 03/12/13 1729  PROBNP 24.5 55.6   CBG:  Recent Labs Lab 06/05/13 1137 06/05/13 1734 06/05/13 2053 06/06/13 0858 06/06/13 1216  GLUCAP 78 155* 90 126* 131*    No results found for this or any previous visit (from the past 240 hour(s)).   Studies: No results found.  Scheduled Meds: . aspirin  81 mg Oral QHS  . atorvastatin  10 mg Oral q1800  . chlorhexidine  15 mL Mouth/Throat QHS  . clopidogrel  75 mg Oral QHS  . dorzolamide  1 drop Both Eyes QHS  . enoxaparin (LOVENOX) injection  40 mg Subcutaneous QHS  . gabapentin  600 mg Oral QHS  .  influenza vac split quadrivalent PF  0.5 mL Intramuscular Once  . insulin aspart  0-9 Units Subcutaneous TID WC  . insulin aspart  4 Units Subcutaneous TID WC  . insulin aspart protamine- aspart  25 Units Subcutaneous BID WC  . isosorbide mononitrate  15 mg Oral QHS  . latanoprost  1 drop Left Eye QHS  . metoprolol tartrate  12.5 mg Oral QHS  . pantoprazole  40 mg Oral Daily  . risperiDONE  0.25 mg Oral QHS  . sertraline  12.5 mg Oral QHS  . sodium chloride  1,000 mL Intravenous Once  . sodium chloride  3 mL Intravenous Q12H  . sucralfate  1 g Oral Daily  . terazosin  5 mg Oral QHS   Continuous Infusions: . sodium chloride 50 mL/hr at 06/04/13 0035  . sodium chloride 1,000 mL (06/06/13 1214)    Principal Problem:   Generalized weakness Active Problems:   Hypertension   Type 2 diabetes mellitus   Schizophrenia, paranoid type   Hyperlipidemia   Depression    Time spent: 53    [facility]  [facility] Pager [phone]. If 7PM-7AM, please contact night-coverage at [email or web address], password [facility] 06/06/2013, 5:25 PM  LOS: 3 days

## 2013-06-06 NOTE — Progress Notes (Signed)
Chart review.  Noted that psych MD recommends inpt tx if Pt continues to hallucinate after medical clearance.  Weekday CSW working on SNF placement.  Psych CSW to follow to assess for inpt tx needs.  Providence Crosby, LCSWA Clinical Social Work [phone]

## 2013-06-06 NOTE — Progress Notes (Signed)
Report received from J. Scotton. Assessment unchanged.  [facility] Pod D

## 2013-06-06 NOTE — Progress Notes (Signed)
Patient complains of severe left flank pain. Diffuse throbbing pain with pinpoint sharp area. Dilaudid IV given.  [facility] Pod D

## 2013-06-07 LAB — URINALYSIS, ROUTINE W REFLEX MICROSCOPIC
Glucose, UA: 100 mg/dL — AB
Nitrite: NEGATIVE
pH: 6.5 (ref 5.0–8.0)

## 2013-06-07 LAB — GLUCOSE, CAPILLARY
Glucose-Capillary: 108 mg/dL — ABNORMAL HIGH (ref 70–99)
Glucose-Capillary: 163 mg/dL — ABNORMAL HIGH (ref 70–99)
Glucose-Capillary: 77 mg/dL (ref 70–99)

## 2013-06-07 NOTE — Progress Notes (Signed)
[facility] PROGRESS NOTE  Bruce [name] WUJ:[id number] DOB: [birth year] DOA: 06/03/2013 PCP: Pcp Not In System  Assessment/Plan: Abdominal pain -Lipase within normal limits and CT scan of abdomen and pelvis shows nonobstructive left kidney stone and no acute abnormalities -Urinalysis with hematuria and status post in and out cath but otherwise negative for infection -Continue pain management, follow. 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 depression  -  UDS + for marijuana and opiates -social work consult for substance/marijuana abuse counseling, TSH within normal limits at 1.745, and troponin negative -  PT/OT recommending SNF  Active Problems:  Type 2 diabetes mellitus  - continue home medical regimen Insulin 70/30 25 units BID - continue SSI and meal coverage 4 units  - poorly controlled A1C 10.1 Hypertension  - ok BP control, follow - will continue home medical regimen  Hyperlipidemia  - continue statin  Depression  - continue zoloft  hypokalemia -Resolved K urinary retention -s/p I/O cath last pm -terazosin changed to flomax 9/20,  follow -Voiding spontaneously overnight, did not require any further catheterrization Hematuria -secondary to I/O cath last 9/19, follow Code Status: full Family Communication: none at bedside Disposition Plan: Likely to SNF when medically ready   Consultants:  Psychiatry  Procedures:  none  Antibiotics:  none  HPI/Subjective: Alert, states no further left-sided abdominal pain. Voiding spontaneously overnight. No  further hallucinations reported  Objective: Filed Vitals:   06/07/13 0524  BP: 123/89  Pulse: 56  Temp: 98.7 F (37.1 C)  Resp: 18    Intake/Output Summary (Last 24 hours) at 06/07/13 1031 Last data filed at 06/07/13 0600  Gross per 24 hour  Intake 2177.5 ml  Output   2100 ml  Net   77.5 ml   Filed Weights   06/04/13 0444 06/05/13 0445 06/07/13 0524  Weight: 79.516 kg (175 lb 4.8 oz) 82.5 kg (181 lb 14.1 oz) 82.7 kg (182 lb 5.1 oz)   Exam:  General: alert & oriented x 3 In NAD Cardiovascular: RRR, nl S1 s2 Respiratory: CTAB Abdomen: soft +Bs, NT, ND, no masses palpable Extremities: No cyanosis and no edema       Data Reviewed: Basic Metabolic Panel:  Recent Labs Lab 06/03/13 1745 06/04/13 0409 06/05/13 0402  NA 133* 136 137  K 3.6 3.3* 3.6  CL 96 104 106  CO2 22 24 26   GLUCOSE 362* 260* 152*  BUN 9 8 7   CREATININE 0.64 0.78 0.82  CALCIUM 9.9 8.6 8.5   Liver Function Tests:  Recent Labs Lab 06/03/13 1745  AST 24  ALT 24  ALKPHOS 90  BILITOT 0.4  PROT 7.2  ALBUMIN 4.0    Recent Labs Lab 06/03/13 1745  LIPASE 56   No results found for this basename: AMMONIA,  in the last 168 hours CBC:  Recent Labs Lab 06/03/13 1745 06/04/13 0409  WBC 9.1 6.7  NEUTROABS 6.9  --   HGB 15.5 13.5  HCT 42.6 38.5*  MCV 87.3 89.1  PLT 159 123*   Cardiac Enzymes:  Recent Labs Lab 06/03/13 2045 06/03/13 2355  CKTOTAL 109 103   BNP (last 3 results)  Recent  Labs  02/28/13 0316 03/12/13 1729  PROBNP 24.5 55.6   CBG:  Recent Labs Lab 06/06/13 0858 06/06/13 1216 06/06/13 1636 06/06/13 2204 06/07/13 0728  GLUCAP 126* 131* 221* 151* 154*    No results found for this or any previous visit (from the past 240 hour(s)).   Studies: No results found.  Scheduled Meds: . aspirin  81 mg Oral QHS  . atorvastatin  10 mg Oral q1800  . chlorhexidine  15 mL Mouth/Throat QHS  . clopidogrel  75 mg Oral QHS  . dorzolamide  1 drop Both Eyes QHS  .  enoxaparin (LOVENOX) injection  40 mg Subcutaneous QHS  . gabapentin  600 mg Oral QHS  . insulin aspart  0-9 Units Subcutaneous TID WC  . insulin aspart  4 Units Subcutaneous TID WC  . insulin aspart protamine- aspart  25 Units Subcutaneous BID WC  . isosorbide mononitrate  15 mg Oral QHS  . latanoprost  1 drop Left Eye QHS  . metoprolol tartrate  12.5 mg Oral QHS  . pantoprazole  40 mg Oral Daily  . risperiDONE  0.25 mg Oral QHS  . sertraline  12.5 mg Oral QHS  . sodium chloride  1,000 mL Intravenous Once  . sodium chloride  3 mL Intravenous Q12H  . sucralfate  1 g Oral Daily  . tamsulosin  0.4 mg Oral QPC breakfast   Continuous Infusions: . sodium chloride 50 mL/hr at 06/07/13 0600  . sodium chloride 1,000 mL (06/06/13 1214)    Principal Problem:   Generalized weakness Active Problems:   Hypertension   Type 2 diabetes mellitus   Schizophrenia, paranoid type   Hyperlipidemia   Depression    Time spent: 85    [facility]  [facility] Pager [phone]. If 7PM-7AM, please contact night-coverage at [email or web address], password [facility] 06/07/2013, 10:31 AM  LOS: 4 days

## 2013-06-08 DIAGNOSIS — R269 Unspecified abnormalities of gait and mobility: Secondary | ICD-10-CM

## 2013-06-08 LAB — GLUCOSE, CAPILLARY
Glucose-Capillary: 133 mg/dL — ABNORMAL HIGH (ref 70–99)
Glucose-Capillary: 153 mg/dL — ABNORMAL HIGH (ref 70–99)

## 2013-06-08 MED ORDER — RISPERIDONE 0.5 MG PO TABS: 0.5000 mg | ORAL_TABLET | Freq: Two times a day (BID) | ORAL | Status: AC

## 2013-06-08 MED ORDER — TAMSULOSIN HCL 0.4 MG PO CAPS: 0.4000 mg | ORAL_CAPSULE | Freq: Every day | ORAL | Status: AC

## 2013-06-08 NOTE — Progress Notes (Signed)
Patient rested well during the night. Vicodin given for generalized pain. C/O of constipation and stated has the urge at times to have BM. Poor bladder control, constantly calling out and dribbling small amounts of urine, condom cath placed. SRP, RN

## 2013-06-08 NOTE — Progress Notes (Signed)
Pt insistent that he take cab home. Unwilling to wait for ambulance for ride home. Tara, CN, in to speak w/ pt.  He told both Delice Bison and this Clinical research associate that he will take care of himself at home and he will not wait for ambulance. Cab is waiting for pt downstairs and he insists that he will walk down if we do not take him. D/C instructions reviewed w/ pt. Pt verbalizes understanding, states no further questions. Pt d/c in w/c in stable condition to cab by this Clinical research associate.  Pt in possession of d/c instructions, scripts, and all personal belongings including cell phone and wallet.

## 2013-06-08 NOTE — Progress Notes (Signed)
Physical Therapy Treatment Patient Details Name: Bruce [name] MRN: [id number] DOB: [birth year] Today's Date: 06/08/2013 Time: 4098-1191 PT Time Calculation (min): 26 min  PT Assessment / Plan / Recommendation  History of Present Illness 67 yo male admitted with generalized weakness, fall, syncope.    PT Comments   Upon entering room before I could introduce myself, pt was very verbal and upset about going home when "they don't know what's wrong with me".  "I am peeing and pooping blood".  Found pt to be soiled in urine sitting in recliner chair with lunch tray untouched.  "I am too upset to eat anything".  After several minuets of venting his frustrations, pt did agree to get up to go to the bathroom.   Very unsteady gait with B knees buckling and Ataxic throughout.  Pt demon difficulty advancing B LE to keep up with how far he was pushing the RW.  Poor self control and poor self correction.  HIGH FALL RISK. Do NOT advise pt amb on his own.    Follow Up Recommendations  SNF     Does the patient have the potential to tolerate intense rehabilitation     Barriers to Discharge        Equipment Recommendations       Recommendations for Other Services    Frequency Min 3X/week   Progress towards PT Goals Progress towards PT goals: Progressing toward goals  Plan      Precautions / Restrictions Precautions Precautions: Fall Restrictions Weight Bearing Restrictions: No    Pertinent Vitals/Pain No c/o pain    Mobility  Bed Mobility Bed Mobility: Not assessed Details for Bed Mobility Assistance: Pt OOB in recliner Transfers Transfers: Sit to Stand;Stand to Sit Sit to Stand: 3: Mod assist;From chair/3-in-1;From toilet Stand to Sit: 3: Mod assist;To chair/3-in-1;To toilet Details for Transfer Assistance: Increased assist needed this session and increased VC's needed this session to stay on task as pt was very verbal about random topics.  Poor safety cognition and pt allwod himself  to basically fall onto commode seat before I could cue him on proper hand placement.  Ambulation/Gait Ambulation/Gait Assistance: 3: Mod assist;2: Max assist Ambulation Distance (Feet): 16 Feet Assistive device: Rolling walker Ambulation/Gait Assistance Details: Very unsteady gait with poor self control of walker as pt was pushing too far to front before his legs could step up.  Pt required 100% physical Assist to prevent fall.  Poor self motor control and corrective reaction to maintain an upright posture.   Gait Pattern: Step-through pattern;Decreased stride length;Trunk flexed;Ataxic Gait velocity: decreased     PT Goals (current goals can now be found in the care plan section)    Visit Information  Last PT Received On: 06/08/13 Assistance Needed: +2 History of Present Illness: 67 yo male admitted with generalized weakness, fall, syncope.     Subjective Data      Cognition  Cognition Arousal/Alertness: Awake/alert Behavior During Therapy: Agitated Overall Cognitive Status: History of cognitive impairments - at baseline    Balance  Balance Balance Assessed: Yes Dynamic Standing Balance Dynamic Standing - Level of Assistance: 2: Max assist  End of Session PT - End of Session Equipment Utilized During Treatment: Gait belt Patient left: in chair;with chair alarm set;with call bell/phone within reach   Felecia Shelling  PTA [facility]  Acute  Rehab Pager      [phone]

## 2013-06-08 NOTE — Progress Notes (Signed)
Occupational Therapy Treatment Patient Details Name: Bruce [name] MRN: [id number] DOB: 04/09/1946 Today's Date: 06/08/2013 Time: 1478-2956 OT Time Calculation (min): 35 min  OT Assessment / Plan / Recommendation  History of present illness 67 yo male admitted with generalized weakness, fall, syncope.    OT comments  Pt currently limited by fatigue and LEs are very shaky and buckling with limited standing activity. Pt at times agitated during session. Nursing made aware and came to room to talk with pt.    Follow Up Recommendations  Supervision/Assistance - 24 hour;SNF (pt currently refuses SNF)    Barriers to Discharge       Equipment Recommendations  3 in 1 bedside comode (pt however refuses DME)    Recommendations for Other Services    Frequency Min 2X/week   Progress towards OT Goals Progress towards OT goals: Not progressing toward goals - comment (pt very shaky and weak with standing)  Plan Discharge plan remains appropriate    Precautions / Restrictions Precautions Precautions: Fall   Pertinent Vitals/Pain No complaint of    ADL  Grooming: Performed;Maximal assistance Where Assessed - Grooming: Unsupported standing ADL Comments: Pt stood at the sink for only about 20 seconds to brush his teeth and noted buckling of LEs. Pt with significant leaning on sink with UEs and trunk to support himself. Nursing in room and states HR in 140s with standing and returned to 70s with rest. Pt declines going to SNF and states, "I am going home." Nusing and therapy both explained pt needs to go to SNF to get rehab to be safer to return home but pt still refuses SNF. Pt also declines at least a BSC to use at home. Explained how shaky and weak pt is on his feet yet he still states he will manage fine at home. Pt is a HIGH fall risk when up.     OT Diagnosis:    OT Problem List:   OT Treatment Interventions:     OT Goals(current goals can now be found in the care plan section)     Visit Information  Last OT Received On: 06/08/13 History of Present Illness: 67 yo male admitted with generalized weakness, fall, syncope.     Subjective Data      Prior Functioning       Cognition  Cognition Arousal/Alertness: Awake/alert Behavior During Therapy: Agitated Overall Cognitive Status: History of cognitive impairments - at baseline    Mobility  Transfers Transfers: Sit to Stand;Stand to Sit Sit to Stand: 4: Min assist;With upper extremity assist;From chair/3-in-1 Stand to Sit: 3: Mod assist;To chair/3-in-1 Details for Transfer Assistance: pt's legs buckling in standing and required mod assist to safely descend to chair. not able to reach back for chair as he was so shaky.     Exercises      Balance Balance Balance Assessed: Yes Dynamic Standing Balance Dynamic Standing - Level of Assistance: 2: Max assist   End of Session OT - End of Session Equipment Utilized During Treatment: Gait belt Activity Tolerance: Patient limited by fatigue Patient left: in chair;with call bell/phone within reach;with chair alarm set  GO     Lennox Laity [phone] 06/08/2013, 12:41 PM

## 2013-06-08 NOTE — Progress Notes (Signed)
Pt adamant that he is going to go home today.  Refusing d/c to SNF stating "I want to go home.  I will do exactly what I did after my heart surgery.  I took care of myself then, I'll take of myself now."  Pt unwilling to discuss home safety/short term rehab despite PT/OT recommendations.  He becomes very upset and agitated. Discussed w/ Dr Donna Bernard who states pt has capacity to make decisions.  Pt has had no signs of visual/auditory hallucinations today and is oriented to person/place/time.  He states very adamantly that "It is my right to go home."  Pt states he has no ride home for d/c.  Tresa Endo, SW, aware and will arrange for transportation for pt.

## 2013-06-08 NOTE — Discharge Summary (Signed)
Physician Discharge Summary  Bruce [name] WUJ:[id number] DOB: 11-04-1945 DOA: 06/03/2013  PCP: Pcp Not In System  Admit date: 06/03/2013 Discharge date: 06/08/2013  Time spent: >30 minutes  Recommendations for Outpatient Follow-up:  Follow-up Information   Follow up with Pcp Not In System. ([facility] for appointment upon discharge)       Please follow up. (Outpatient psychiatrist, as previously, call for appointment upon discharge )        Discharge Diagnoses:  Principal Problem:   Generalized weakness Active Problems:   Hypertension   Type 2 diabetes mellitus PTSD/schizoaffective disorder- per psychiatry   History of Schizophrenia, paranoid type   Hyperlipidemia   Depression   Discharge Condition: improved/stable  Diet recommendation: Modified carbohydrate  Filed Weights   06/05/13 0445 06/07/13 0524 06/08/13 0619  Weight: 82.5 kg (181 lb 14.1 oz) 82.7 kg (182 lb 5.1 oz) 81.3 kg (179 lb 3.7 oz)    History of present illness:  Pt is 67 yo male with significant and multiple psychiatric illnesses, HTN, HLD, DM insulin dependent, and medical non compliance who was brought to [facility] ED via EMS when family found him lying on the floor. Pt explains he felt very weak at home and fell one day prior to this admission and he is unable to specify how long he has been lying on the floor. He explains he has pain in the entire body, constant and 10/10 in severity with no specific alleviating factors but worse with even minimal movement. He denies chest pain or shortness of breath, no specific abdominal or urinary concerns.  In ED, pt clinically and hemodynamically stable, TRH asked to admit for observation and evaluation of syncopal etiology.    Hospital Course:  Abdominal pain  -Upon admission Lipase was done and was within normal limits and CT scan of abdomen and pelvis showed nonobstructive left kidney stone and no acute abnormalities  -Urinalysis showed hematuria and  it was noted that it was status post in and out cath but otherwise negative for infection  -his pain managed with IV analgesics initially and as he improved>>changed oral analgesics as needed, -Pt did complain of constipation and was treated with prn dulcolax/bowel regimen -On follow up pt's pain had resolved, he was tolerating PO well and was discharged to follow up outpt with his PCP at the [place].  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 He did not have any further hallucinations and so there was no indication for inpatient psych and he was discharged home to follow up with his psychiatrist outpt  -pt's urine was +for marijuana and SW was consulted for counseling/resources to quit. Generalized weakness  - likely secondary to progressive deconditioning, ? depression  - UDS + for marijuana and opiates -social work consult for substance/marijuana abuse counseling as above, TSH within normal limits at 1.745, and troponin negative  - PT/OT recommended SNF but pt declined and also declined all HH services. Pt was alert, lucid and had not been having any further hallucinations mentioned above.  Active Problems:  Type 2 diabetes mellitus  -  poorly controlled A1C was elevated at 10.1  - pt was maintained on his home medical regimen Insulin 70/30 25 units BID and his accuchecks monitored and covered with SSI and meal coverage 4 units  Hypertension  - ok BP control, follow  - he was maintained on his home meds and was to follow up oupt. Hyperlipidemia  - continue statin  Depression  -  continue zoloft  hypokalemia  -his  K was replaced in the hospital  urinary retention  -pt was retaining urine and required I/O cath on admission and once more during the course of his hospital stay. He was on terazosin and it was changed to flomax 9/20, follow  -on follow up he was Voiding spontaneously and did not require any further catheterization  Hematuria  -secondary to I/O cath with cath. Trauma.Urine cleared up on follow up.   Procedures:  none  Consultations:  Psychiatry  Discharge Exam: Filed Vitals:   06/08/13 0619  BP: 123/59  Pulse: 55  Temp: 98.4 F (36.9 C)  Resp: 18   Exam:  General: alert & oriented x 3 In NAD  Cardiovascular: RRR, nl S1 s2  Respiratory: CTAB  Abdomen: soft +Bs, NT, ND, no masses palpable  Extremities: No cyanosis and no edema    Discharge Instructions  Discharge Orders   Future Orders Complete By Expires   Diet Carb Modified  As directed    Increase activity slowly  As directed        Medication List    STOP taking these medications       terazosin 5 MG capsule  Commonly known as:  HYTRIN      TAKE these medications       aspirin 81 MG chewable tablet  Chew 81 mg by mouth at bedtime.     atorvastatin 10 MG tablet  Commonly known as:  LIPITOR  Take 1 tablet (10 mg total) by mouth daily.     clopidogrel 75 MG tablet  Commonly known as:  PLAVIX  Take 75 mg by mouth at bedtime.     dorzolamide 2 % ophthalmic solution  Commonly known as:  TRUSOPT  Place 1 drop into both eyes at bedtime.     gabapentin 600 MG tablet  Commonly known as:  NEURONTIN  Take 1 tablet (600 mg total) by mouth at bedtime.     insulin NPH-regular (70-30) 100 UNIT/ML injection  Commonly known as:  NOVOLIN 70/30  - Inject 25-29 Units into the skin 2 (two) times daily with a meal. 25 units in the morning  - 29 units in the evening     insulin regular 100 units/mL injection  Commonly known as:  NOVOLIN R,HUMULIN R  Inject 3-20 Units into  the skin 3 (three) times daily before meals. Per sliding scale.     isosorbide mononitrate 30 MG 24 hr tablet  Commonly known as:  IMDUR  Take 15 mg by mouth at bedtime.     latanoprost 0.005 % ophthalmic solution  Commonly known as:  XALATAN  Place 1 drop into the left eye at bedtime.     metoprolol tartrate 25 MG tablet  Commonly known as:  LOPRESSOR  Take 12.5 mg by mouth at bedtime.     nitroGLYCERIN 0.4 MG SL tablet  Commonly known as:  NITROSTAT  Place 0.4 mg under the tongue every 5 (five) minutes as needed for chest pain.     pantoprazole 40 MG tablet  Commonly known as:  PROTONIX  Take 1 tablet (40 mg total) by mouth daily.     risperiDONE 0.5 MG tablet  Commonly known as:  RISPERDAL  Take 1 tablet (0.5 mg total) by mouth 2 (two) times daily.     sertraline 25 MG tablet  Commonly known as:  ZOLOFT  Take 12.5 mg by mouth at bedtime.     sucralfate 1 G tablet  Commonly known as:  CARAFATE  Take 1 tablet (1 g total) by mouth daily.     tamsulosin 0.4 MG Caps capsule  Commonly known as:  FLOMAX  Take 1 capsule (0.4 mg total) by mouth daily after breakfast.       Allergies  Allergen Reactions  . Codeine Other (See Comments)    "causes me to pass out; regain consciousness; pass out, regain consciousness; like a ping pong - back and forth"  . Cogentin [Benztropine] Other (See Comments)    "like I'm heavily sedated/drugged; hard for me to comprehend things" (07/25/2012)       Follow-up Information   Follow up with Pcp Not In System. ([facility] for [facility] upon discharge)       Please follow up. (Outpatient psychiatrist, as previously, call for appointment upon discharge )        The results of significant diagnostics from this hospitalization (including imaging, microbiology, ancillary and laboratory) are listed below for reference.    Significant Diagnostic Studies: Dg Thoracic Spine 2 View  06/03/2013   CLINICAL DATA:  Back pain.   Abdominal pain.  EXAM: THORACIC SPINE - 2 VIEW  COMPARISON:  No priors.  FINDINGS: AP and lateral views of the thoracic spine demonstrate no definite acute displaced fractures or compression type fractures. Multilevel degenerative disc disease is noted. Alignment appears anatomic allowing for patient's rotation to the right.  IMPRESSION: No acute radiographic abnormality of the thoracic spine.   Electronically Signed   By: Trudie Reed M.D.   On: 06/03/2013 18:43   Dg Lumbar Spine Complete  06/03/2013   CLINICAL DATA:  Low back pain.  EXAM: LUMBAR SPINE - COMPLETE 4+ VIEW  COMPARISON:  CT of the abdomen and pelvis 02/28/2013.  FINDINGS: Five views of the lumbar spine demonstrate no definite acute displaced fracture or compression type fracture of the lumbar spine. Alignment is anatomic. Multilevel degenerative disc disease, most severe at L1-L2 and L5-S1. Multilevel facet arthropathy, most pronounced at L4-L5 and L5-S1. No defects of the pars interarticularis are noted.  IMPRESSION: 1. No acute radiographic abnormality of the lumbar spine. 2. 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   Electronically Signed   By: Trudie Reed M.D.   On: 06/03/2013 18:44   Ct Abdomen Pelvis W Contrast  06/03/2013   CLINICAL DATA:  Left flank pain, dysuria.  EXAM: CT ABDOMEN AND PELVIS WITH CONTRAST  TECHNIQUE: Multidetector CT imaging of the abdomen and pelvis was performed using the standard protocol following bolus administration of intravenous contrast.  CONTRAST:  OMNIPAQUE IOHEXOL 300 MG/ML  SOLN  COMPARISON:  02/28/2013  FINDINGS: Dependent atelectasis in the visualized lung bases. Patchy coronary calcifications. Surgical clips in the gallbladder fossa. Unremarkable liver, spleen, adrenal glands, right kidney, pancreas. 2mm calculus in the upper pole left kidney without hydronephrosis. Patchy aortoiliac plaque without aneurysm or stenosis. Portal vein patent. Stomach, small  bowel, and colon nondilated. Urinary bladder physiologically distended. There is marked prostatic enlargement. No ascites. No free air. No adenopathy localized. Multilevel spondylitic changes of the lower  thoracic and lumbar spine most marked L1-2 and L5-S1.  IMPRESSION: No acute abdominal process.  Left nephrolithiasis without hydronephrosis as before.  Marked prostatic enlargement.  Atherosclerosis, including aortoiliac and coronary artery disease. Please note that although the presence of coronary artery calcium documents the presence of coronary artery disease, the severity of this disease and any potential stenosis cannot be assessed on this non-gated CT examination. Assessment for potential risk factor modification, dietary therapy or pharmacologic therapy may be warranted, if clinically indicated.   Electronically Signed   By: Oley Balm M.D.   On: 06/03/2013 18:59   Dg Chest Portable 1 View  06/03/2013   CLINICAL DATA:  Abdominal pain  EXAM: PORTABLE CHEST - 1 VIEW  COMPARISON:  03/12/2013  FINDINGS: Low lung volumes with resulting crowding of bronchovascular structures. Heart size upper limits normal. No effusion. Regional bones grossly unremarkable. Surgical clips in the right upper abdomen.  IMPRESSION: No active disease.   Electronically Signed   By: Oley Balm M.D.   On: 06/03/2013 20:23    Microbiology: No results found for this or any previous visit (from the past 240 hour(s)).   Labs: Basic Metabolic Panel:  Recent Labs Lab 06/03/13 1745 06/04/13 0409 06/05/13 0402  NA 133* 136 137  K 3.6 3.3* 3.6  CL 96 104 106  CO2 22 24 26   GLUCOSE 362* 260* 152*  BUN 9 8 7   CREATININE 0.64 0.78 0.82  CALCIUM 9.9 8.6 8.5   Liver Function Tests:  Recent Labs Lab 06/03/13 1745  AST 24  ALT 24  ALKPHOS 90  BILITOT 0.4  PROT 7.2  ALBUMIN 4.0    Recent Labs Lab 06/03/13 1745  LIPASE 56   No results found for this basename: AMMONIA,  in the last 168  hours CBC:  Recent Labs Lab 06/03/13 1745 06/04/13 0409  WBC 9.1 6.7  NEUTROABS 6.9  --   HGB 15.5 13.5  HCT 42.6 38.5*  MCV 87.3 89.1  PLT 159 123*   Cardiac Enzymes:  Recent Labs Lab 06/03/13 2045 06/03/13 2355  CKTOTAL 109 103   BNP: BNP (last 3 results)  Recent Labs  02/28/13 0316 03/12/13 1729  PROBNP 24.5 55.6   CBG:  Recent Labs Lab 06/07/13 1139 06/07/13 1652 06/07/13 2127 06/08/13 0815 06/08/13 1200  GLUCAP 108* 163* 77 133* 153*       Signed:  Jozlynn Plaia C  [facility] 06/08/2013, 2:35 PM

## 2013-06-08 NOTE — Progress Notes (Signed)
Pt called cab for ride home. States he cannot and will not wait for ambulance ride home as he must be there by 6pm or he will be locked out of his apt building.  Pt states he has enough cash in his wallet to cover ride home. Attempted to call SW for update on [facility] arrival and have left message.

## 2013-06-08 NOTE — Progress Notes (Signed)
Pt states he must be home by 6pm or will be locked out of apt buliding. Tresa Endo, CSW, aware and will call [facility].

## 2013-06-08 NOTE — Progress Notes (Signed)
CSW spoke with patient re: discharge plans - CSW had provided SNF bed offers to patient Friday. Patient now states that he plans to go home at discharge rather than SNF. RNCMs, Cookie & Bonita Quin made aware. Dr. Suanne Marker also made aware.   CSW will continue to follow, should he change his mind again re: SNF.   Unice Bailey, LCSW [facility] Clinical Social Worker cell #: [phone]

## 2013-06-08 NOTE — Care Management Note (Signed)
  Page 2 of 2   06/08/2013     4:40:15 PM   CARE MANAGEMENT NOTE 06/08/2013  Patient:  Bruce [name], Bruce [name]   Account Number:  [id number]  Date Initiated:  06/04/2013  Documentation initiated by:  Ezekiel Ina  Subjective/Objective Assessment:   pt admitted with cco AMS with a psychiatric history     Action/Plan:   from home   Anticipated DC Date:  06/06/2013   Anticipated DC Plan:  [facility]  In-house referral  Clinical Social Worker      DC Planning Services  CM consult      Choice offered to / List presented to:          [facility] arranged  HH - 11 Patient Refused      Status of service:  In process, will continue to follow Medicare Important Message given?  NO (If response is "NO", the following Medicare IM given date fields will be blank) Date Medicare IM given:   Date Additional Medicare IM given:    Discharge Disposition:    Per UR Regulation:  Reviewed for med. necessity/level of care/duration of stay  If discussed at Long Length of Stay Meetings, dates discussed:    Comments:  06/08/2013 Colleen Can BSN RN CCM [phone] Pt has refused SNF placemnt. Orders for [facility] services have been ordered by attending MD. Pt is planning to go bck to his home in [place]. He is refusung to have home health services set arranged. States he has previously been discharged from [facility] services in the past by several [facility] agencies. Offered list of personal care services as needed. States he does not want nor is he willing to pay for personal care services. States [facility] in the past but are not willing to pay for any more personall care services for him. Pt states he has son in [place] who is  coming to [facility]. Pt states he has walker,and shower chair along with other DME that is still in boxes. Cookie-CM advised of the above.  06/04/13 MMcGibboney, RN, BSN Chart reviewed.

## 2013-06-08 NOTE — Progress Notes (Signed)
CSW and RNCM, Bonita Quin spoke with patient to confirm plans to discharge home. Patient requested non-emergency ambulance transport home. [facility] called, forms in [place]. RN, Vinnie Langton aware.   Unice Bailey, LCSW [facility] Clinical Social Worker cell #: [phone]

## 2013-06-09 LAB — URINE CULTURE: Culture: NO GROWTH

## 2013-10-12 ENCOUNTER — Telehealth: Payer: Self-pay | Admitting: Cardiovascular Disease

## 2013-10-12 NOTE — Telephone Encounter (Signed)
Noted. thx 

## 2013-10-12 NOTE — Telephone Encounter (Signed)
Noted - will forward to Dr Excell Seltzerooper for his knowledge

## 2013-10-12 NOTE — Telephone Encounter (Signed)
FYI   Pt states he will be moving to [place] soon, and will no longer be making appointments. Wanted notify office. Pt plans to get his records from Medical Records.

## 2014-04-28 IMAGING — CR DG CHEST 1V PORT
1 series · 1 of 1 positions shown · non-contrast
Comparison: 10/23/2008

CLINICAL DATA: Shortness of breath, chest pain, myocardial
infarction

PORTABLE CHEST - 1 VIEW

[AP]
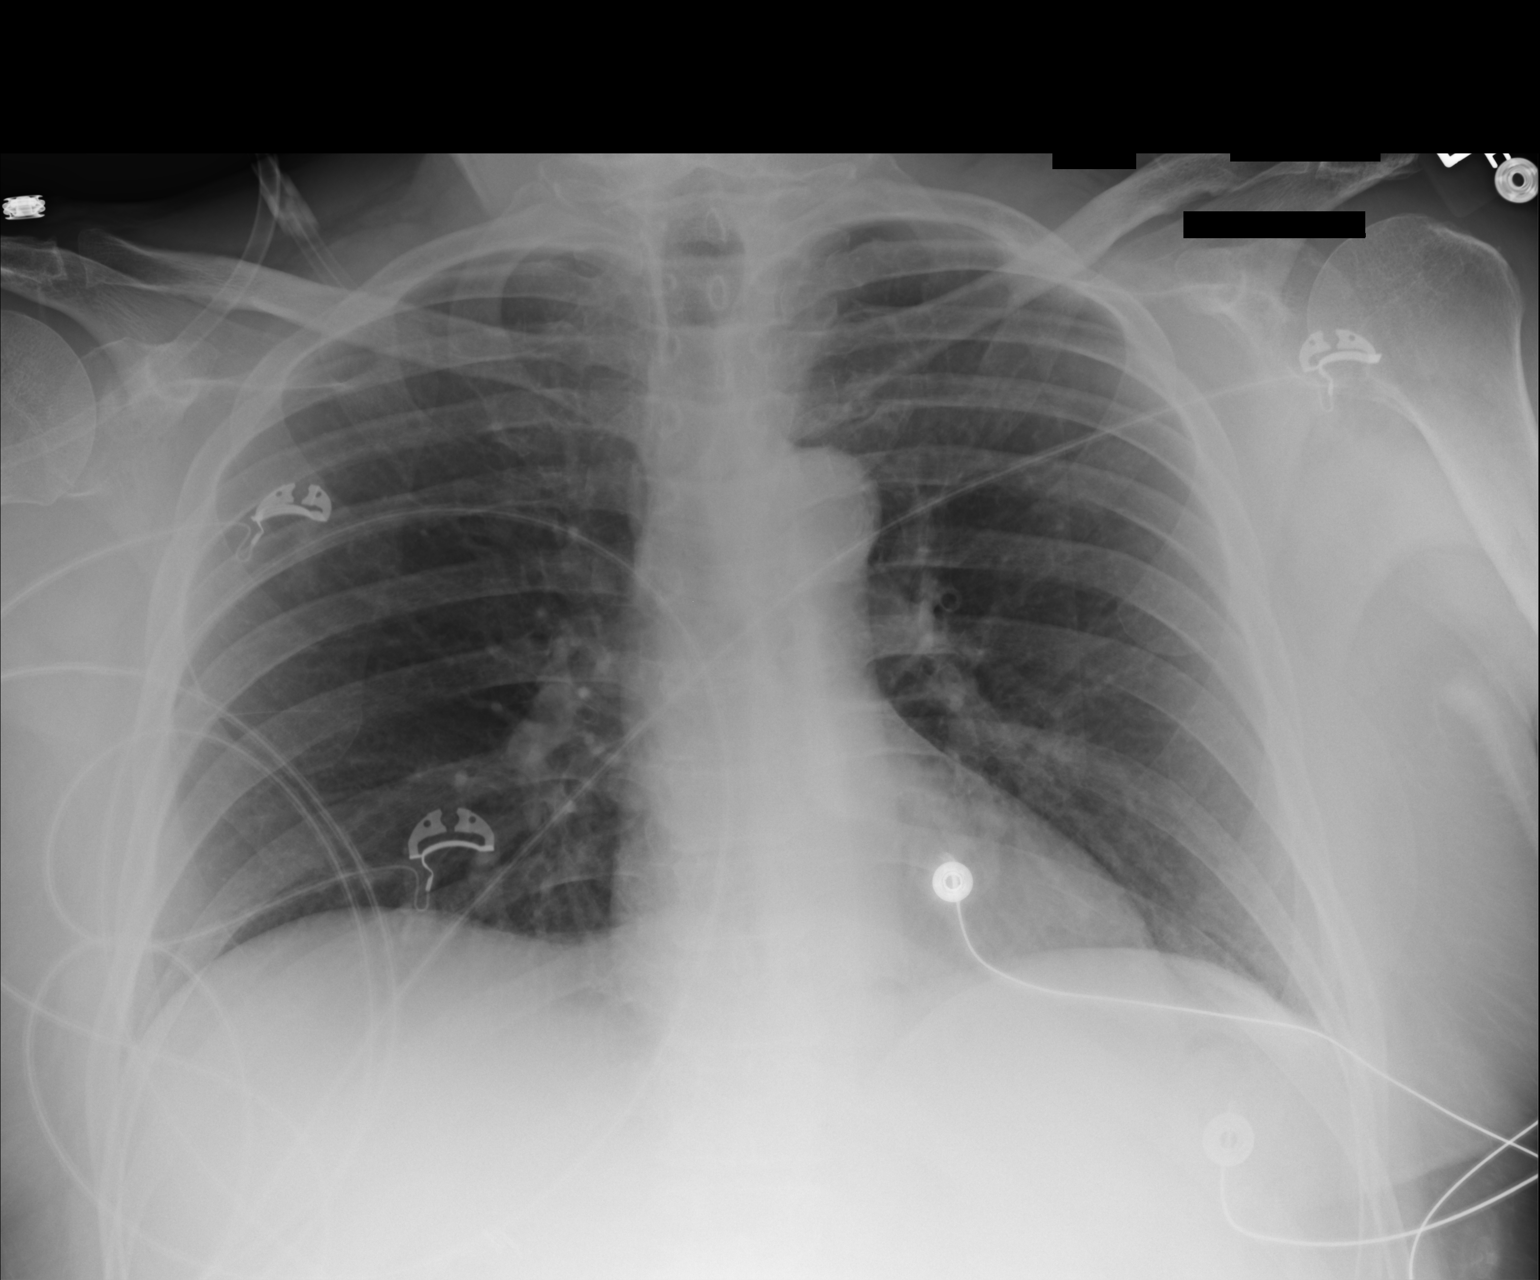

[1 of 1 positions shown; findings below may reference images not displayed]

FINDINGS: Unchanged cardiac silhouette and mediastinal contours.
Lung volumes are reduced with minimal bibasilar opacities, left
greater than right, likely atelectasis.  No definite pleural
effusion or pneumothorax.  No definite evidence of pulmonary edema.
Unchanged bones.
IMPRESSION: Decreased lung volumes without acute cardiopulmonary disease.

## 2014-08-26 ENCOUNTER — Encounter (HOSPITAL_COMMUNITY): Payer: Self-pay | Admitting: Cardiology

## 2015-07-30 IMAGING — CT CT ABD-PELV W/ CM
1 of 3 series · 14 of 32 positions shown, 19 images · IV contrast (OMNIPAQUE 300)
Comparison: 02/28/2013

CLINICAL DATA: Left flank pain, dysuria.

EXAM:
CT ABDOMEN AND PELVIS WITH CONTRAST
TECHNIQUE: Multidetector CT imaging of the abdomen and pelvis was performed
using the standard protocol following bolus administration of
intravenous contrast.
CONTRAST:  100mL OMNIPAQUE IOHEXOL 300 MG/ML  SOLN

[Series 2: abd/pel with · axial · 0.85mm/px · z∈[-488,-54]mm · 14 of 97 slices shown, 19 images]
[im 5/97  soft-tissue]
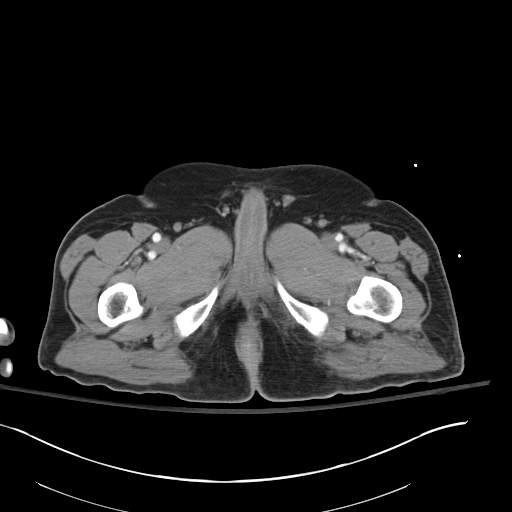
[im 5/97  bone]
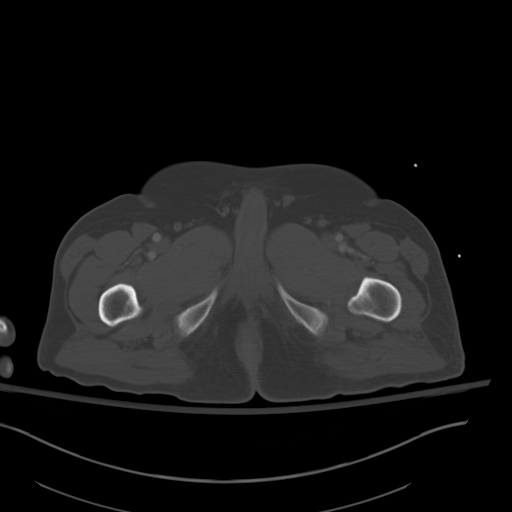
[im 15/97  soft-tissue]
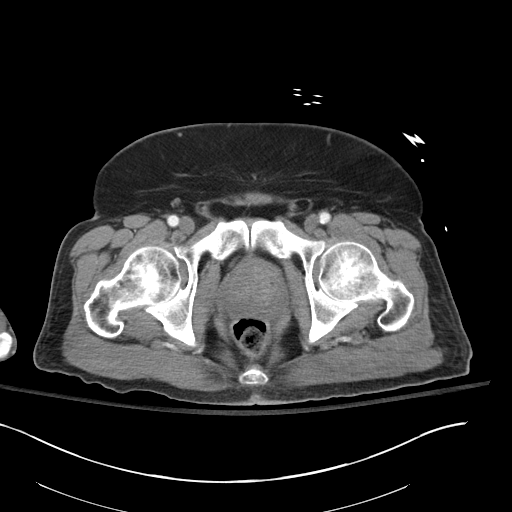
[im 20/97  soft-tissue]
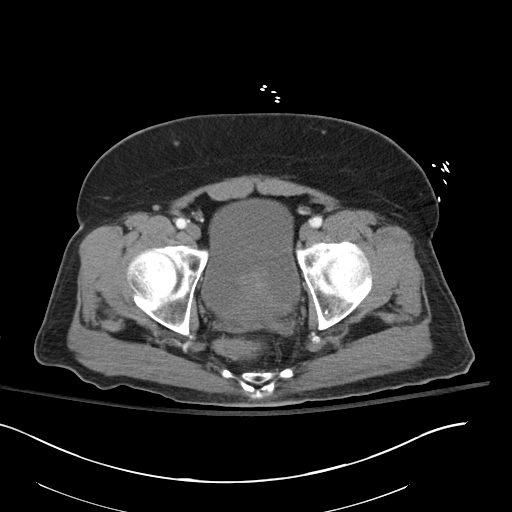
[im 29/97  soft-tissue]
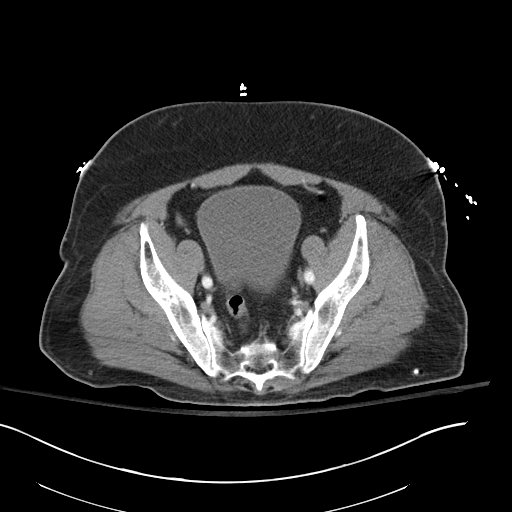
[im 34/97  soft-tissue]
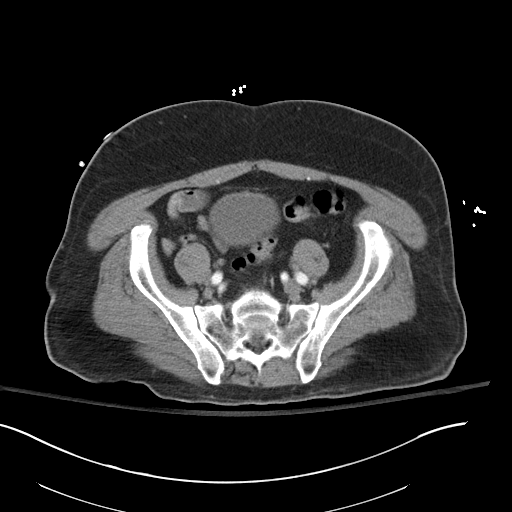
[im 44/97  soft-tissue]
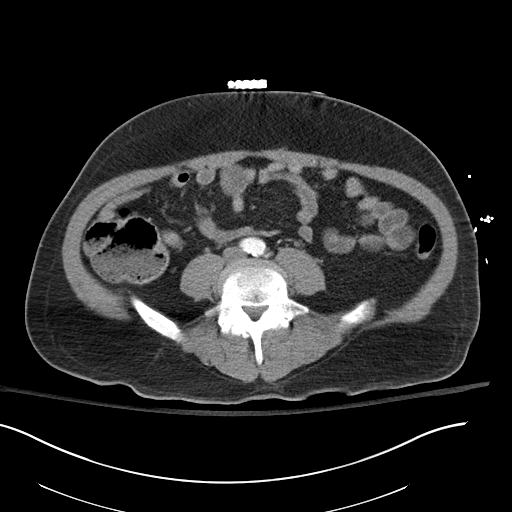
[im 49/97  soft-tissue]
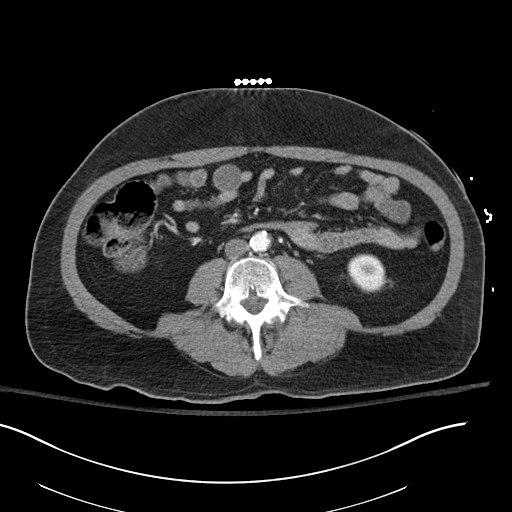
[im 53/97  soft-tissue]
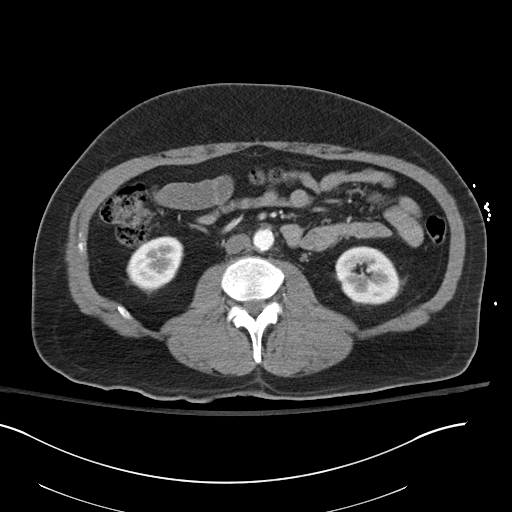
[im 63/97  soft-tissue]
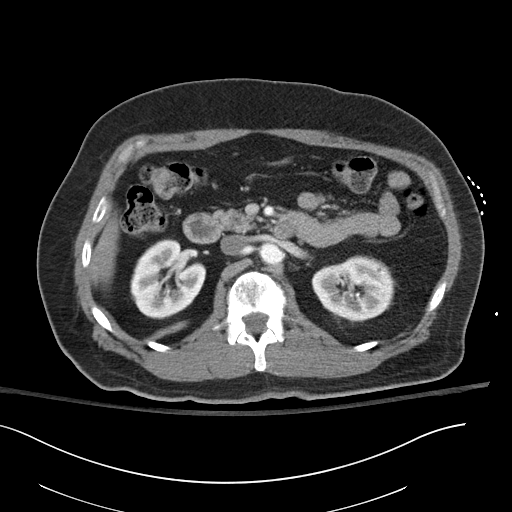
[im 63/97  bone]
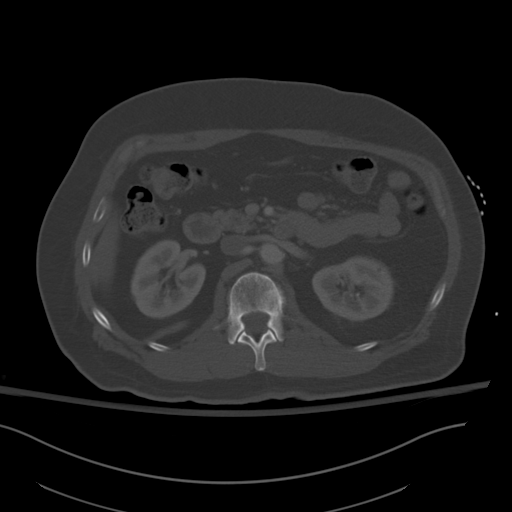
[im 68/97  soft-tissue]
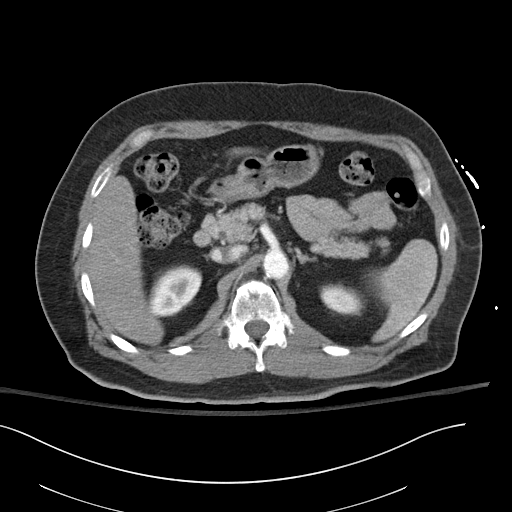
[im 77/97  soft-tissue]
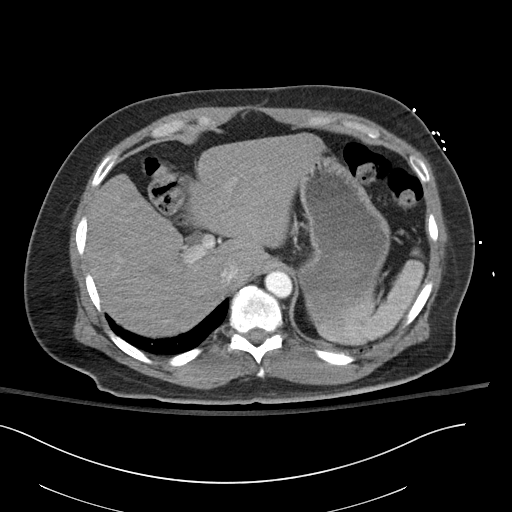
[im 77/97  lung]
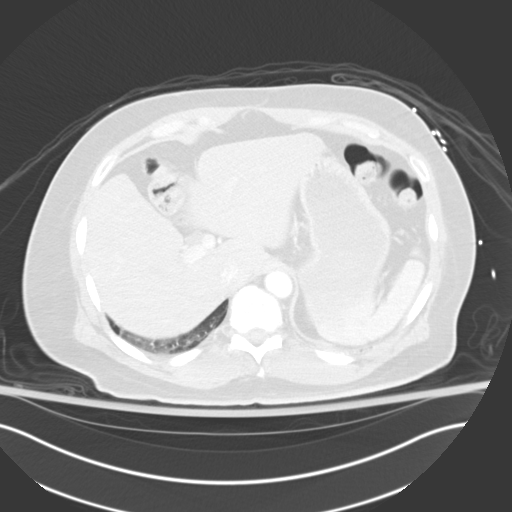
[im 82/97  soft-tissue]
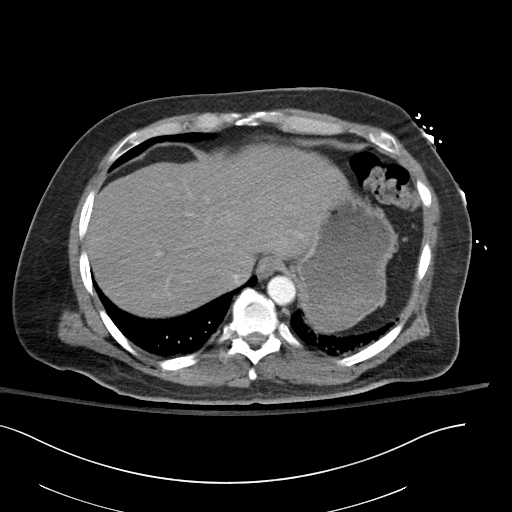
[im 82/97  lung]
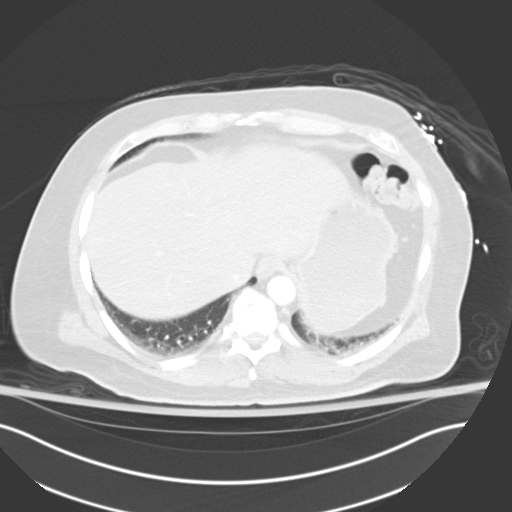
[im 87/97  lung]
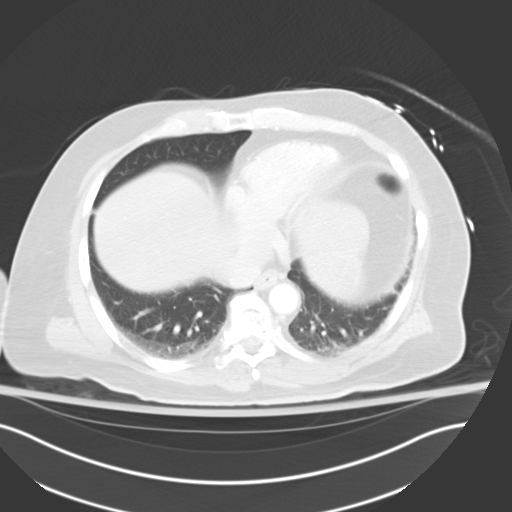
[im 92/97  soft-tissue]
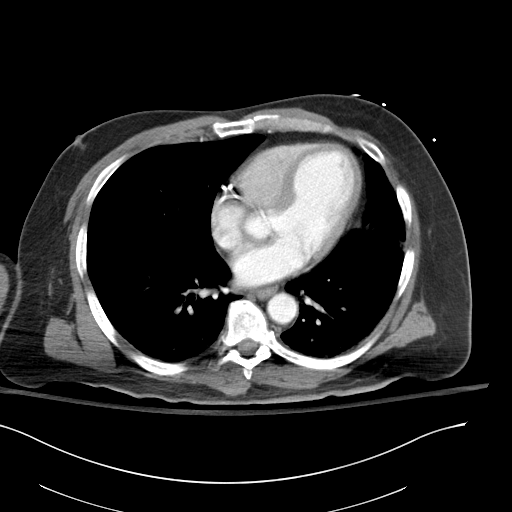
[im 92/97  lung]
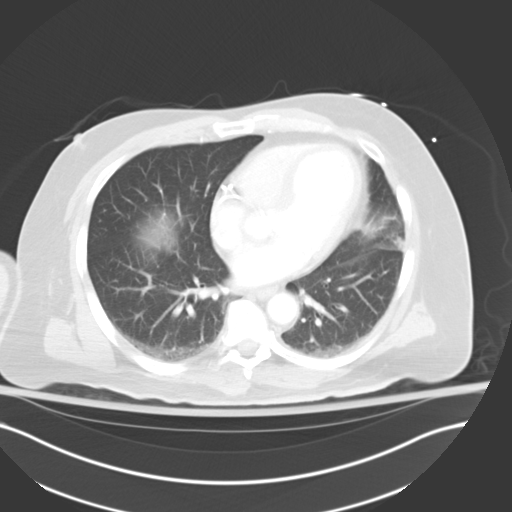

[14 of 32 positions shown; findings below may reference images not displayed]

FINDINGS: Dependent atelectasis in the visualized lung bases. Patchy coronary
calcifications. Surgical clips in the gallbladder fossa.
Unremarkable liver, spleen, adrenal glands, right kidney, pancreas.
2mm calculus in the upper pole left kidney without hydronephrosis.
Patchy aortoiliac plaque without aneurysm or stenosis. Portal vein
patent. Stomach, small bowel, and colon nondilated. Urinary bladder
physiologically distended. There is marked prostatic enlargement. No
ascites. No free air. No adenopathy localized. Multilevel
spondylitic changes of the lower thoracic and lumbar spine most
marked L1-2 and L5-S1.
IMPRESSION: No acute abdominal process.

Left nephrolithiasis without hydronephrosis as before.

Marked prostatic enlargement.

Atherosclerosis, including aortoiliac and coronary artery disease.
Please note that although the presence of coronary artery calcium
documents the presence of coronary artery disease, the severity of
this disease and any potential stenosis cannot be assessed on this
non-gated CT examination. Assessment for potential risk factor
modification, dietary therapy or pharmacologic therapy may be
warranted, if clinically indicated.
